# Patient Record
Sex: Female | Born: 1955 | Race: White | Hispanic: No | Smoking: Never smoker
Health system: Southern US, Community
[De-identification: ages and names within clinical notes are randomized; demographics above are authoritative.]

## PROBLEM LIST (undated history)

## (undated) DIAGNOSIS — C439 Malignant melanoma of skin, unspecified: Secondary | ICD-10-CM

## (undated) DIAGNOSIS — R2689 Other abnormalities of gait and mobility: Secondary | ICD-10-CM

## (undated) DIAGNOSIS — K219 Gastro-esophageal reflux disease without esophagitis: Secondary | ICD-10-CM

## (undated) DIAGNOSIS — F329 Major depressive disorder, single episode, unspecified: Secondary | ICD-10-CM

## (undated) DIAGNOSIS — R0609 Other forms of dyspnea: Secondary | ICD-10-CM

## (undated) DIAGNOSIS — D7589 Other specified diseases of blood and blood-forming organs: Secondary | ICD-10-CM

## (undated) DIAGNOSIS — R7301 Impaired fasting glucose: Secondary | ICD-10-CM

## (undated) DIAGNOSIS — E2839 Other primary ovarian failure: Secondary | ICD-10-CM

## (undated) DIAGNOSIS — K3184 Gastroparesis: Secondary | ICD-10-CM

## (undated) DIAGNOSIS — G510 Bell's palsy: Secondary | ICD-10-CM

## (undated) DIAGNOSIS — F419 Anxiety disorder, unspecified: Secondary | ICD-10-CM

## (undated) DIAGNOSIS — R5383 Other fatigue: Secondary | ICD-10-CM

## (undated) DIAGNOSIS — I10 Essential (primary) hypertension: Secondary | ICD-10-CM

## (undated) DIAGNOSIS — E7800 Pure hypercholesterolemia, unspecified: Secondary | ICD-10-CM

## (undated) DIAGNOSIS — F32A Depression, unspecified: Secondary | ICD-10-CM

## (undated) DIAGNOSIS — R300 Dysuria: Secondary | ICD-10-CM

## (undated) DIAGNOSIS — D219 Benign neoplasm of connective and other soft tissue, unspecified: Secondary | ICD-10-CM

## (undated) HISTORY — DX: Dysuria: R30.0

## (undated) HISTORY — DX: Malignant melanoma of skin, unspecified: C43.9

## (undated) HISTORY — PX: TONSILECTOMY, ADENOIDECTOMY, BILATERAL MYRINGOTOMY AND TUBES: SHX2538

## (undated) HISTORY — DX: Gastroparesis: K31.84

## (undated) HISTORY — DX: Impaired fasting glucose: R73.01

## (undated) HISTORY — DX: Depression, unspecified: F32.A

## (undated) HISTORY — DX: Major depressive disorder, single episode, unspecified: F32.9

## (undated) HISTORY — PX: FOOT SURGERY: SHX648

## (undated) HISTORY — DX: Other specified diseases of blood and blood-forming organs: D75.89

## (undated) HISTORY — DX: Anxiety disorder, unspecified: F41.9

## (undated) HISTORY — DX: Other abnormalities of gait and mobility: R26.89

## (undated) HISTORY — DX: Gastro-esophageal reflux disease without esophagitis: K21.9

## (undated) HISTORY — DX: Other forms of dyspnea: R06.09

## (undated) HISTORY — DX: Benign neoplasm of connective and other soft tissue, unspecified: D21.9

## (undated) HISTORY — DX: Other primary ovarian failure: E28.39

## (undated) HISTORY — DX: Other fatigue: R53.83

## (undated) HISTORY — DX: Pure hypercholesterolemia, unspecified: E78.00

## (undated) HISTORY — PX: ANKLE FRACTURE SURGERY: SHX122

## (undated) HISTORY — DX: Essential (primary) hypertension: I10

## (undated) HISTORY — DX: Bell's palsy: G51.0

## (undated) HISTORY — PX: TUBAL LIGATION: SHX77

---

## 1985-02-01 HISTORY — PX: TYMPANOSTOMY TUBE PLACEMENT: SHX32

## 1985-02-01 HISTORY — PX: NASAL SINUS SURGERY: SHX719

## 2000-01-04 ENCOUNTER — Encounter: Admission: RE | Admit: 2000-01-04 | Discharge: 2000-01-04 | Payer: Self-pay | Admitting: Sports Medicine

## 2000-03-01 ENCOUNTER — Other Ambulatory Visit: Admission: RE | Admit: 2000-03-01 | Discharge: 2000-03-01 | Payer: Self-pay | Admitting: Emergency Medicine

## 2000-11-22 ENCOUNTER — Encounter: Payer: Self-pay | Admitting: Family Medicine

## 2000-11-22 ENCOUNTER — Encounter: Admission: RE | Admit: 2000-11-22 | Discharge: 2000-11-22 | Payer: Self-pay | Admitting: Family Medicine

## 2001-01-13 ENCOUNTER — Encounter (INDEPENDENT_AMBULATORY_CARE_PROVIDER_SITE_OTHER): Payer: Self-pay | Admitting: *Deleted

## 2001-01-13 ENCOUNTER — Ambulatory Visit (HOSPITAL_COMMUNITY): Admission: RE | Admit: 2001-01-13 | Discharge: 2001-01-13 | Payer: Self-pay | Admitting: Gastroenterology

## 2002-03-23 ENCOUNTER — Encounter: Payer: Self-pay | Admitting: Family Medicine

## 2002-03-23 ENCOUNTER — Encounter: Admission: RE | Admit: 2002-03-23 | Discharge: 2002-03-23 | Payer: Self-pay | Admitting: Family Medicine

## 2003-07-19 ENCOUNTER — Encounter (INDEPENDENT_AMBULATORY_CARE_PROVIDER_SITE_OTHER): Payer: Self-pay | Admitting: Specialist

## 2003-07-19 ENCOUNTER — Ambulatory Visit (HOSPITAL_COMMUNITY): Admission: RE | Admit: 2003-07-19 | Discharge: 2003-07-19 | Payer: Self-pay | Admitting: Gastroenterology

## 2003-07-30 ENCOUNTER — Ambulatory Visit (HOSPITAL_COMMUNITY): Admission: RE | Admit: 2003-07-30 | Discharge: 2003-07-30 | Payer: Self-pay

## 2005-05-21 ENCOUNTER — Encounter: Admission: RE | Admit: 2005-05-21 | Discharge: 2005-05-21 | Payer: Self-pay | Admitting: Family Medicine

## 2005-06-08 ENCOUNTER — Encounter: Admission: RE | Admit: 2005-06-08 | Discharge: 2005-06-08 | Payer: Self-pay | Admitting: Family Medicine

## 2005-07-23 ENCOUNTER — Encounter: Admission: RE | Admit: 2005-07-23 | Discharge: 2005-07-23 | Payer: Self-pay | Admitting: *Deleted

## 2005-12-10 ENCOUNTER — Other Ambulatory Visit: Admission: RE | Admit: 2005-12-10 | Discharge: 2005-12-10 | Payer: Self-pay | Admitting: Family Medicine

## 2006-08-31 ENCOUNTER — Encounter: Admission: RE | Admit: 2006-08-31 | Discharge: 2006-08-31 | Payer: Self-pay | Admitting: Family Medicine

## 2007-06-14 ENCOUNTER — Other Ambulatory Visit: Admission: RE | Admit: 2007-06-14 | Discharge: 2007-06-14 | Payer: Self-pay | Admitting: Family Medicine

## 2007-10-18 ENCOUNTER — Encounter: Admission: RE | Admit: 2007-10-18 | Discharge: 2007-10-18 | Payer: Self-pay | Admitting: Family Medicine

## 2008-11-06 ENCOUNTER — Other Ambulatory Visit: Admission: RE | Admit: 2008-11-06 | Discharge: 2008-11-06 | Payer: Self-pay | Admitting: Family Medicine

## 2008-11-18 ENCOUNTER — Encounter: Admission: RE | Admit: 2008-11-18 | Discharge: 2008-11-18 | Payer: Self-pay | Admitting: Family Medicine

## 2009-11-28 ENCOUNTER — Encounter: Admission: RE | Admit: 2009-11-28 | Discharge: 2009-11-28 | Payer: Self-pay | Admitting: Family Medicine

## 2010-05-06 ENCOUNTER — Other Ambulatory Visit: Payer: Self-pay | Admitting: Family Medicine

## 2010-05-06 ENCOUNTER — Other Ambulatory Visit (HOSPITAL_COMMUNITY)
Admission: RE | Admit: 2010-05-06 | Discharge: 2010-05-06 | Disposition: A | Discharge status: Home / Self Care | Payer: BC Managed Care – PPO | Source: Ambulatory Visit | Attending: Family Medicine | Admitting: Family Medicine

## 2010-05-06 DIAGNOSIS — Z01419 Encounter for gynecological examination (general) (routine) without abnormal findings: Secondary | ICD-10-CM | POA: Insufficient documentation

## 2010-06-19 NOTE — Op Note (Signed)
NAME:  Carrie [name], Carrie [name]                         ACCOUNT NO.:  [id number]   MEDICAL RECORD NO.:  [id number]                   PATIENT TYPE:  AMB   LOCATION:  ENDO                                 FACILITY:  [facility]   PHYSICIAN:  John C. Madilyn Fireman, M.D.                 DATE OF BIRTH:  1955/10/07   DATE OF PROCEDURE:  07/19/2003  DATE OF DISCHARGE:                                 OPERATIVE REPORT   PROCEDURE:  Esophagogastroduodenoscopy.   INDICATION FOR PROCEDURE:  Chronic nausea and dyspepsia in [name] patient also  undergoing screening colonoscopy today.   PROCEDURE:  The patient was placed in the left lateral decubitus position  and placed on the pulse monitor with continuous low-flow oxygen delivered by  nasal cannula.  She was sedated with 70 mcg IV fentanyl and 7 mg IV Versed.  The Olympus video endoscope was advanced under direct vision into the  oropharynx and esophagus.  The esophagus was straight and of normal caliber  with the squamocolumnar line at 38 cm.  There was no visible hiatal hernia,  ring, stricture, or other abnormality of the GE junction.  The stomach was  entered and [name] small amount of liquid secretions was suctioned from the  fundus.  Retroflexed view of the cardia was unremarkable.  The fundus, body,  antrum, and pylorus all appeared normal.  The duodenum was entered and both  the bulb and second portion were well-inspected and appeared to be within  normal limits.  The scope was then withdrawn and the patient returned to the  recovery room in stable condition.  She tolerated the procedure well, and  there were no immediate complications.   IMPRESSION:  Normal endoscopy.   PLAN:  Will consider [name] nuclear medicine gastric emptying study based on the  nature of her symptoms, and will proceed to colonoscopy.                                               John C. Madilyn Fireman, M.D.    JCH/MEDQ  D:  07/19/2003  T:  07/20/2003  Job:  [id number]

## 2010-06-19 NOTE — Op Note (Signed)
NAME:  Carrie [name], Carrie [name]                         ACCOUNT NO.:  [id number]   MEDICAL RECORD NO.:  [id number]                   PATIENT TYPE:  AMB   LOCATION:  ENDO                                 FACILITY:  [facility]   PHYSICIAN:  John C. Madilyn Fireman, M.D.                 DATE OF BIRTH:  [birth year]   DATE OF PROCEDURE:  07/19/2003  DATE OF DISCHARGE:                                 OPERATIVE REPORT   PROCEDURE PERFORMED:  Colonoscopy.   ENDOSCOPIST:  Barrie Folk, M.D.   INDICATIONS FOR PROCEDURE:  Average risk colon cancer screening in [name] 55-year-  old patient with no prior screening.   DESCRIPTION OF PROCEDURE:  The patient was placed in the left lateral  decubitus position and placed on the pulse monitor with continuous low-flow  oxygen delivered by nasal cannula.  The patient was sedated with 30 mcg of  IV fentanyl and 3 mg of IV Versed in addition to the medicines given for the  previous esophagogastroduodenoscopy.  The Olympus video colonoscope was  inserted into the rectum and advanced to the cecum, confirmed by  transillumination of McBurney's point and visualization of the ileocecal  valve and appendiceal orifice.  The prep was excellent.  The cecum,  ascending, transverse, descending and sigmoid colon all appeared normal with  no masses, polyps, diverticula or other mucosal abnormalities.  In the  rectum there was [name] 6 mm polyp at 15 cm and this was removed by hot biopsy.  The remainder of the rectum appeared normal.  The scope was then withdrawn  and the patient returned to the recovery room in stable condition.  She  tolerated the procedure well.  There were no immediate complications.   IMPRESSION:  Small rectal polyp, otherwise normal study.   PLAN:  Await histology to determine method and interval for future colon  screening.                                               John C. Madilyn Fireman, M.D.    J[facility]/MEDQ  D:  07/19/2003  T:  07/20/2003  Job:  [id number]

## 2010-06-19 NOTE — Procedures (Signed)
[facility]  Patient:    Carrie [name], Carrie [name] Visit Number: [id number] MRN: [id number]          Service Type: END Location: ENDO Attending Physician:  Louie Bun Dictated by:   Everardo All Madilyn Fireman, M.D. Proc. Date: 01/13/01 Admit Date:  01/13/2001                             Procedure Report  PROCEDURE:  Esophagogastroduodenoscopy with biopsy.  INDICATION FOR PROCEDURE:  Multiple abdominal complaints with gaseousness, bloating and other vague dyspeptic symptoms quite bothersome to the patient with negative work-up to date and failure to respond to acid therapy, Beano, Lactaid, and Gas-X.  DESCRIPTION OF PROCEDURE:  The patient was placed in the left lateral decubitus position and placed on the pulse monitor with continuous low-flow oxygen delivered by nasal cannula.  She was sedated with 100 mg IV Demerol and ______ Olympus video endoscope was advanced under direct vision into the oropharynx and esophagus.  The esophagus was straight and of normal caliber with the squamocolumnar line at 38 cm.  There was no visible hiatal hernia, ring, stricture, or other abnormality of the GE junction.  The stomach was entered, and a small amount of liquid secretions were suctioned from the fundus.  Retroflexed view of the cardia was unremarkable.  The fundus, body, antrum, and pylorus all appeared normal.  The duodenum was entered, and both the bulb and second portion were well-inspected and appeared to be within normal limits.  Duodenal biopsies were taken to rule out celiac disease.  The scope was withdrawn back into the stomach and a CLOtest obtained.  The scope was then withdrawn, and the patient returned to the recovery room in stable condition.  She tolerated the procedure well, and there were no immediate complications.  IMPRESSION:  Essentially normal endoscopy.  PLAN:  Await small bowel biopsies and CLOtest.  If unrevealing, will try her on an empiric  course of proton pump inhibitor which she has not been on in the past.  Follow up in the office in a few weeks. Dictated by:   Everardo All Madilyn Fireman, M.D. Attending Physician:  Louie Bun DD:  01/13/01 TD:  01/13/01 Job: [id number]

## 2010-11-12 ENCOUNTER — Other Ambulatory Visit: Payer: Self-pay | Admitting: Family Medicine

## 2010-11-12 DIAGNOSIS — Z1231 Encounter for screening mammogram for malignant neoplasm of breast: Secondary | ICD-10-CM

## 2010-12-04 ENCOUNTER — Ambulatory Visit
Admission: RE | Admit: 2010-12-04 | Discharge: 2010-12-04 | Disposition: A | Discharge status: Home / Self Care | Payer: BC Managed Care – PPO | Source: Ambulatory Visit | Attending: Family Medicine | Admitting: Family Medicine

## 2010-12-04 DIAGNOSIS — Z1231 Encounter for screening mammogram for malignant neoplasm of breast: Secondary | ICD-10-CM

## 2011-11-15 ENCOUNTER — Other Ambulatory Visit: Payer: Self-pay | Admitting: Family Medicine

## 2011-11-15 DIAGNOSIS — Z1231 Encounter for screening mammogram for malignant neoplasm of breast: Secondary | ICD-10-CM

## 2011-12-13 ENCOUNTER — Ambulatory Visit (INDEPENDENT_AMBULATORY_CARE_PROVIDER_SITE_OTHER): Payer: BC Managed Care – PPO | Admitting: Family Medicine

## 2011-12-13 ENCOUNTER — Encounter: Payer: Self-pay | Admitting: Family Medicine

## 2011-12-13 VITALS — BP 127/70 | Ht 61.0 in | Wt 122.0 lb

## 2011-12-13 DIAGNOSIS — S76319A Strain of muscle, fascia and tendon of the posterior muscle group at thigh level, unspecified thigh, initial encounter: Secondary | ICD-10-CM

## 2011-12-13 DIAGNOSIS — S76312A Strain of muscle, fascia and tendon of the posterior muscle group at thigh level, left thigh, initial encounter: Secondary | ICD-10-CM

## 2011-12-13 DIAGNOSIS — IMO0002 Reserved for concepts with insufficient information to code with codable children: Secondary | ICD-10-CM

## 2011-12-13 MED ORDER — NITROGLYCERIN 0.2 MG/HR TD PT24: MEDICATED_PATCH | TRANSDERMAL | Status: DC

## 2011-12-13 MED ORDER — PREDNISONE (PAK) 10 MG PO TABS: ORAL_TABLET | ORAL | Status: DC

## 2011-12-13 NOTE — Progress Notes (Signed)
  Subjective:    Patient ID: Carrie [name], female    DOB: 1956-01-20, 56 y.o.   MRN: [id number]  PCP: Dr. Gerri Spore  HPI 56 yo F here for left leg injury.  Patient reports about 2 months ago started to develop proximal left leg/hamstring pain without injury. Would be worse with walking/running, better with rest but not completely so. 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 No swelling, bruising. No numbness/tingling. No radiation into rest of leg. No bowel/bladder dysfunction. No prior left hamstring/buttock injuries. No right sidep ain. No back pain. Tried advil, ice, rest, passive stretching (this brings on the pain).  History reviewed. No pertinent past medical history.  Current Outpatient Prescriptions on File Prior to Visit  Medication Sig Dispense Refill  . CYMBALTA 30 MG capsule       . nitroGLYCERIN (NITRODUR - DOSED IN MG/24 HR) 0.2 mg/hr Apply 1/4th patch to affected hamstring and change daily  30 patch  1    History reviewed. No pertinent past surgical history.  Allergies  Allergen Reactions  . Sulfa Antibiotics     History   Social History  . Marital Status: Divorced    Spouse Name: N/A    Number of Children: N/A  . Years of Education: N/A   Occupational History  . Not on file.   Social History Main Topics  . Smoking status: Never Smoker   . Smokeless tobacco: Not on file  . Alcohol Use: Not on file  . Drug Use: Not on file  . Sexually Active: Not on file   Other Topics Concern  . Not on file   Social History Narrative  . No narrative on file    Family History  Problem Relation Age of Onset  . Hypertension Father   . Diabetes Neg Hx   . Heart attack Neg Hx   . Hyperlipidemia Neg Hx   . Sudden death Neg Hx     BP 127/70  Ht 5\' 1"  (1.549 m)  Wt 122 lb (55.339 kg)  BMI 23.05 kg/m2  Review of Systems See HPI  above.    Objective:   Physical Exam Gen: NAD  Back/L leg: No gross deformity, scoliosis. TTP medial hamstring proximally and at insertion.  Less TTP around piriformis.  No back, other left leg TTP. FROM without pain of back, left hip. Strength 4/5 with knee flexion at 30 degrees, 5-/5 at 90 degrees - both reproduce her pain.  5/5 strength with all other motions including abduction.  Some pain with ext rotation.   2+ MSRs in patellar and achilles tendons, equal bilaterally. Negative SLRs. Sensation intact to light touch bilaterally. Negative logroll bilateral hips Negative fabers and piriformis stretches.    Assessment & Plan:  1. Left hamstring strain - History and exam consistent with left proximal hamstring strain.  Ultrasound identified edema overlying insertion, some neovascularity as well that's not present right hamstring insertion.  Shown home rehabilitation exercises, discussed slowly advancing activities.  Start nitro patches as well.  Compression shorts.  She would like to try prednisone dose pack.  See instructions for further.  F/u in 6 weeks.

## 2011-12-13 NOTE — Assessment & Plan Note (Signed)
History and exam consistent with left proximal hamstring strain.  Ultrasound identified edema overlying insertion, some neovascularity as well that's not present right hamstring insertion.  Shown home rehabilitation exercises, discussed slowly advancing activities.  Start nitro patches as well.  Compression shorts.  She would like to try prednisone dose pack.  See instructions for further.  F/u in 6 weeks.

## 2011-12-13 NOTE — Patient Instructions (Addendum)
You have strained your proximal hamstring. Hamstring curls, swings, running lunges 3 sets of 10 once a day. Standing hip rotations 3 sets of 10 once a day also If these become too easy and pain is a 1-2/10 or less, add ankle weight. When you have 1/10 pain or no pain with the entire program, move to phase 2 (will likely be about 3 weeks). Phase 2 - add more weight (5 pounds) to curls and swings, jumping exercises I showed you (jumping lunge, bounding). Use compression shorts with exercise at least next 6 weeks Avoid speed work, hills as much as possible. Stretching should be done after exercise or after a 5-10 minute warmup - for flexibility hold stretches for 20-30 seconds, repeat 3 times. Heat before, ice after. Nitroglycerin 1/4th of a patch over affected hamstring - change every day into a different spot. Prednisone dose pack as directed x 6 days - do not take ibuprofen, advil, aleve with this. Tylenol 2 tabs four times a day as needed for pain. Use pain as your guide - when not limping and pain is 1-2/10 with brisk walk or jogging, ok to start walk:jog program. 1:1 walk:jog for total 10 minutes - increase total exercise time by 5 minutes every other day and jog time by 1-2 minutes (so next run would be total 15 minutes of 1:2 walk: jog) Follow up with me in 6 weeks.

## 2011-12-17 ENCOUNTER — Ambulatory Visit
Admission: RE | Admit: 2011-12-17 | Discharge: 2011-12-17 | Disposition: A | Discharge status: Home / Self Care | Payer: BC Managed Care – PPO | Source: Ambulatory Visit | Attending: Family Medicine | Admitting: Family Medicine

## 2011-12-17 DIAGNOSIS — Z1231 Encounter for screening mammogram for malignant neoplasm of breast: Secondary | ICD-10-CM

## 2012-04-05 ENCOUNTER — Other Ambulatory Visit: Payer: Self-pay | Admitting: Dermatology

## 2012-11-22 ENCOUNTER — Other Ambulatory Visit: Payer: Self-pay

## 2012-11-22 DIAGNOSIS — Z1231 Encounter for screening mammogram for malignant neoplasm of breast: Secondary | ICD-10-CM

## 2012-12-29 ENCOUNTER — Ambulatory Visit
Admission: RE | Admit: 2012-12-29 | Discharge: 2012-12-29 | Disposition: A | Discharge status: Home / Self Care | Payer: BC Managed Care – PPO | Source: Ambulatory Visit

## 2012-12-29 DIAGNOSIS — Z1231 Encounter for screening mammogram for malignant neoplasm of breast: Secondary | ICD-10-CM

## 2013-02-27 ENCOUNTER — Ambulatory Visit: Payer: BC Managed Care – PPO | Admitting: Family Medicine

## 2013-04-03 ENCOUNTER — Other Ambulatory Visit: Payer: Self-pay | Admitting: Dermatology

## 2013-07-05 ENCOUNTER — Other Ambulatory Visit: Payer: Self-pay | Admitting: Family Medicine

## 2013-07-05 ENCOUNTER — Other Ambulatory Visit (HOSPITAL_COMMUNITY)
Admission: RE | Admit: 2013-07-05 | Discharge: 2013-07-05 | Disposition: A | Discharge status: Home / Self Care | Payer: BC Managed Care – PPO | Source: Ambulatory Visit | Attending: Family Medicine | Admitting: Family Medicine

## 2013-07-05 DIAGNOSIS — Z124 Encounter for screening for malignant neoplasm of cervix: Secondary | ICD-10-CM | POA: Insufficient documentation

## 2013-07-05 DIAGNOSIS — Z1151 Encounter for screening for human papillomavirus (HPV): Secondary | ICD-10-CM | POA: Insufficient documentation

## 2013-07-09 LAB — CYTOLOGY - PAP

## 2014-01-10 ENCOUNTER — Other Ambulatory Visit: Payer: Self-pay

## 2014-01-10 DIAGNOSIS — Z1231 Encounter for screening mammogram for malignant neoplasm of breast: Secondary | ICD-10-CM

## 2014-01-11 ENCOUNTER — Other Ambulatory Visit: Payer: Self-pay

## 2014-01-31 ENCOUNTER — Ambulatory Visit
Admission: RE | Admit: 2014-01-31 | Discharge: 2014-01-31 | Disposition: A | Discharge status: Home / Self Care | Payer: BC Managed Care – PPO | Source: Ambulatory Visit

## 2014-01-31 DIAGNOSIS — Z1231 Encounter for screening mammogram for malignant neoplasm of breast: Secondary | ICD-10-CM

## 2014-05-09 ENCOUNTER — Other Ambulatory Visit: Payer: Self-pay | Admitting: Gastroenterology

## 2014-11-18 ENCOUNTER — Telehealth: Payer: Self-pay | Admitting: Neurology

## 2014-11-18 NOTE — Telephone Encounter (Signed)
Terrence Dupont,  Dr. Ernesto Rutherford would like me to see this patient. Last appointment of the morning or last appt in the evening would be best so I have extra time thank you !!!

## 2014-11-18 NOTE — Telephone Encounter (Signed)
Dr. Ernesto Rutherford [phone] would like for Dr. Jaynee Eagles to call him back regarding patient (not a current [facility] patient).

## 2014-11-19 NOTE — Telephone Encounter (Signed)
LVM for pt to call back and schedule NP appointment with Dr. Jaynee Eagles. Please schedule last of the morning and end of the day so Dr. Jaynee Eagles has enough time. Gave [facility] phone number and office hours.

## 2014-11-25 NOTE — Telephone Encounter (Signed)
LVM for pt to call and schedule NP appt with Dr. Arrie Senate per Dr. Ernesto Rutherford request. Please schedule last of morning/end of day so Dr. Jaynee Eagles has enough time.

## 2014-11-26 NOTE — Telephone Encounter (Signed)
LVM for pt to call and reschedule NP appt made. Was supposed to be scheduled last of the morning or end of the day. Apologized to the pt. Gave [facility] phone number.

## 2014-11-27 NOTE — Telephone Encounter (Signed)
Pt scheduled on 12/04/14 at 2pm. Dr Jaynee Eagles okay with this.

## 2014-12-03 ENCOUNTER — Ambulatory Visit: Payer: 59 | Admitting: Neurology

## 2014-12-04 ENCOUNTER — Ambulatory Visit (INDEPENDENT_AMBULATORY_CARE_PROVIDER_SITE_OTHER): Payer: 59 | Admitting: Neurology

## 2014-12-04 ENCOUNTER — Encounter: Payer: Self-pay | Admitting: Neurology

## 2014-12-04 VITALS — BP 128/84 | HR 55 | Ht 61.0 in | Wt 125.6 lb

## 2014-12-04 DIAGNOSIS — H8110 Benign paroxysmal vertigo, unspecified ear: Secondary | ICD-10-CM | POA: Diagnosis not present

## 2014-12-04 MED ORDER — SCOPOLAMINE 1 MG/3DAYS TD PT72: 1.0000 | MEDICATED_PATCH | TRANSDERMAL | Status: DC

## 2014-12-04 NOTE — Patient Instructions (Addendum)
Remember to drink plenty of fluid, eat healthy meals and do not skip any meals. Try to eat protein with a every meal and eat a healthy snack such as fruit or nuts in between meals. Try to keep a regular sleep-wake schedule and try to exercise daily, particularly in the form of walking, 20-30 minutes a day, if you can.   As far as your medications are concerned, I would like to suggest: Scopolamine patch every 3 days  I would like to see you back as needed, sooner if we need to. Please call us with any interim questions, concerns, problems, updates or refill requests.   Our phone number is [phone]. We also have an after hours call service for urgent matters and there is a physician on-call for urgent questions. For any emergencies you know to call 911 or go to the nearest emergency room  VESTIBULAR REHABILITATION Potential benefits - Studies in humans and animal models have shown that clinical recovery after peripheral vestibular injury occurs in advance of improved peripheral vestibular function, suggesting that most of the early recovery and a substantial portion of the total recovery derives from central nervous system compensation [5-8]. This central compensation appears to be multisensory in its scope and is the primary target of vestibular rehabilitation [9]. There is some evidence that early rehabilitation is more effective than late intervention [10,11].  Vestibular rehabilitation (physical therapy) promotes recovery. It is not known whether vestibular rehabilitation is useful for central vestibular disorders, although preliminary evidence suggests that it might have benefit [13,14]. Referral can be arranged through most physiotherapy departments or dizzy clinics. Most patients with vertigo prefer to lie with their head still. Vestibular rehabilitation forces them to perform challenging balance exercises with several potential benefits:  ?Activity promotes adaptation - The brain can  readjust or adapt its responses to take into account reduced vestibular input, particularly if one side is still normal. This is optimally accomplished when the brain has experience with vision during head motion, to determine how much error the lesion has introduced [15].  ?Activity facilitates strategic substitution - There are other means of reducing spatial uncertainty, even if the vestibular system cannot recover. The cervico-ocular reflex can increase its input, and other eye movements can help stabilize gaze [16]. Alternative spatial cues from vision and proprioception can improve balance and walking.  ?Inactivity has secondary negative effects - Patients may become physically deconditioned, which exacerbates the inadequacy of their postural reflexes. They may also become psychologically deconditioned, sometimes to the point where a "persistent postural-perceptual dizziness" (also called phobic postural vertigo, chronic subjective dizziness) becomes the greatest obstacle to their recovery [17-20]. Fear of falling is particularly problematic in the elderly after a vestibular event, and it can limit mobility indefinitely without a rehabilitation program [21].  Cause-specific efficacy - Vestibular exercises have been used for 60 years [22,23]. However, they have only more recently been studied in randomized, controlled trials. Limitations of these studies include that they are unblinded with short-term follow-up. The evidence of their benefit is most robust for unilateral peripheral vestibular disorders [24]. There is not sufficient evidence to determine whether one form of rehabilitation is more effective than another.  ?Acute peripheral vertigo - The exercises commonly provided for persons with acute vestibular events include focusing on an object with a blank background and moving the head slowly to the right and left. The exercise is done in the pitch (up/down) and yaw (left/right) planes. Shortly  after a vestibular injury the patient is encouraged to  move his or her head slowly to avoid severe nausea. Then the speed is increased as the patient tolerates. Most patients are asked to perform these exercises two to three times each day for several minutes.  Two trials have shown that vestibular exercises begun shortly after the onset of acute unilateral vestibular hypofunction due to vestibular neuritis or vestibular surgery significantly improve balance at one month [25,26]. It is not known whether the improvements persist beyond one month.  ?Chronic peripheral vertigo - The exercises recommended for persons with chronic peripheral vertigo are generally much more aggressive than those for persons with acute disorders. Patients are asked to perform eye and head movements while standing, walking forward, walking backward, and while standing or walking on compliant or uneven surfaces. The focus of the program is to perform increasingly more difficult postural tasks with increasingly more eye and head movements while keeping symptoms manageable.  A study of patients with chronic dizziness for at least six months duration due to unilateral peripheral vestibular dysfunction found that dizziness improved with either vestibular therapy or drugs, but improved balance occurred only in the group receiving physical therapy [27].  Treatment with home exercises may be as effective as supervised outpatient therapy [28-30]. Two randomized controlled trials of patients with chronic peripheral vestibular disorders in general practice evaluated a standard home vestibular program [28,29]. Treated patients had significant improvement on all primary outcome measures (dizziness, dizziness-related quality of life, and postural stability) compared with the control groups at three and six months of follow-up [10]. A limitation of these studies is that participants could not be blinded to treatment status.
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
## 2014-12-05 ENCOUNTER — Telehealth: Payer: Self-pay | Admitting: *Deleted

## 2014-12-05 NOTE — Telephone Encounter (Signed)
Release faxed on 12/05/14 requesting imaging.

## 2014-12-09 ENCOUNTER — Telehealth: Payer: Self-pay | Admitting: Neurology

## 2014-12-09 NOTE — Telephone Encounter (Signed)
We requested records, but we have not received them yet. Carrie [name] can you verify we got the medical release form and her ophthalmologist's name ot get there ecords? thanks

## 2014-12-09 NOTE — Telephone Encounter (Signed)
Called pt back. Advised I spoke with medical records and we have not received requested records yet. Carrie [name] is MR is going to call first thing in the morning to check on status. Pt wanted to have any surgery if Dr Jaynee Eagles wants her to have one before the first of the year because she is going to lose insurance. She is back to work and walking and not running. I told her I will call her back to update her once I know more. She verbalized understanding.

## 2014-12-09 NOTE — Telephone Encounter (Signed)
Thanks, not sure I can give her advice on whether to have surgery for cular spasms on not but I can read the notes and tell her what ophthalmologist says. She should really follow back up with him but I will call her about this. thanks

## 2014-12-09 NOTE — Telephone Encounter (Signed)
Patient called to inquire if Dr. Jaynee Eagles contacted Dr. Rosezetta Schlatter Specialist?

## 2014-12-10 NOTE — Telephone Encounter (Signed)
Notes on [employer].

## 2014-12-10 NOTE — Telephone Encounter (Signed)
Where are the records?

## 2014-12-10 NOTE — Telephone Encounter (Signed)
Received records from [facility]. Will give to Dr Jaynee Eagles.

## 2014-12-11 NOTE — Telephone Encounter (Signed)
Dr Jaynee Eagles-- I put them with your office notes.

## 2014-12-12 NOTE — Telephone Encounter (Signed)
Left a message for patient. 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 I di dnot notice that on exam. She would like me to explain the opthalmologist's notes and findings. i left a message for him and have not heard back. At this point I feel it is beter for her to go see him in the office and ask him to explain his findings and what he wants her to do. She mentioned a surgical procedure but I didn't see that in there. She really needs to follow up with him directly. thanks

## 2015-03-11 ENCOUNTER — Other Ambulatory Visit: Payer: Self-pay

## 2015-03-11 DIAGNOSIS — Z1231 Encounter for screening mammogram for malignant neoplasm of breast: Secondary | ICD-10-CM

## 2015-04-04 ENCOUNTER — Ambulatory Visit
Admission: RE | Admit: 2015-04-04 | Discharge: 2015-04-04 | Disposition: A | Discharge status: Home / Self Care | Payer: BLUE CROSS/BLUE SHIELD | Source: Ambulatory Visit

## 2015-04-04 DIAGNOSIS — Z1231 Encounter for screening mammogram for malignant neoplasm of breast: Secondary | ICD-10-CM

## 2015-09-12 DIAGNOSIS — F411 Generalized anxiety disorder: Secondary | ICD-10-CM | POA: Diagnosis not present

## 2015-09-12 DIAGNOSIS — F3342 Major depressive disorder, recurrent, in full remission: Secondary | ICD-10-CM | POA: Diagnosis not present

## 2015-10-01 ENCOUNTER — Ambulatory Visit (INDEPENDENT_AMBULATORY_CARE_PROVIDER_SITE_OTHER): Payer: BLUE CROSS/BLUE SHIELD | Admitting: Sports Medicine

## 2015-10-01 ENCOUNTER — Encounter: Payer: Self-pay | Admitting: Sports Medicine

## 2015-10-01 DIAGNOSIS — S76312A Strain of muscle, fascia and tendon of the posterior muscle group at thigh level, left thigh, initial encounter: Secondary | ICD-10-CM | POA: Diagnosis not present

## 2015-10-01 MED ORDER — NITROGLYCERIN 0.2 MG/HR TD PT24: MEDICATED_PATCH | TRANSDERMAL | 1 refills | Status: DC

## 2015-10-01 NOTE — Progress Notes (Signed)
Carrie [name] - 60 y.o. female MRN [id number]  Date of birth: [birth year]  SUBJECTIVE:  Including CC & ROS.  Chief Complaint  Patient presents with  . Hip Pain     [name] is a 60 year old female that is presenting with left hamstring pain. This is been ongoing for about a year. This pain is acute on chronic in nature. She remembers running about a year ago and had a significant fall forward. Since that time she has had a deal with this issue. The pain is located at the left upper hamstring were he goes into her buttock. She has tried mobic, Advil, prednisone. She is also tried rest and no rest with no improvement. She has not tried any formal physical therapy. She has pain with sitting. She has tried dry and healing with no improvement. She denies any prior injury to that area. She has had to change the way that she is running. 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 She is having to run less and her walking seems to be affected as well. The pain is localized with no radiation. She denies any radiculopathy.  ROS: No unexpected weight loss, fever, chills, swelling, instability, numbness/tingling, redness, otherwise see HPI    HISTORY: Past Medical, Surgical, Social, and Family History Reviewed & Updated per EMR.   Pertinent Historical Findings include: PMSHx -  HTN, BPPV  PSHx -  No tobacco. Occasional alcohol use, works as a Physiological scientist  FHx -  HTN Medications - cymbalta,   DATA REVIEWED: None to review   PHYSICAL EXAM:  VS: BP:(!) 140/100  HR:(!) 55bpm  TEMP: ( )  RESP:   HT:5\' 2"  (157.5 cm)   WT:125 lb (56.7 kg)  BMI:22.9 PHYSICAL EXAM: Gen: NAD, alert, cooperative with exam, well-appearing HEENT: clear conjunctiva, EOMI CV:  no edema, capillary refill brisk,  Resp: non-labored, normal speech Skin: no rashes, normal turgor  Neuro: no gross deficits.  Psych:  alert and oriented HIP:  No overlying ecchymosis or obvious defect No  pain to palpation of the greater trochanter bilaterally. Normal internal and external rotation of the hip bilaterally. No pain to palpation of the piriformis bilaterally. No pain to palpation over the SI joints bilaterally. No pain to palpation over the cervical spine. Some pain to palpation over the initial tuberosity of the left side. No pain with knee extension. Some pain reproduced with resistance to need flexion. Negative Fabere and Feder bilaterally Negative straight leg raise bilaterally. +2 deep tendon reflexes the patella and Achilles bilaterally. Neurovascularly intact distally. Gait She is taking short strides and having an antalgic gait. Looks to be in pain while running.  Limited ultrasound: Left hamstring: Initial tuberosity was viewed in plane and out of plane. Origin of the biceps femoris had hypoechoic changes as well as thickening suggestive of tendinopathy. There was a small calcification within the tendon at the ischial tuberosity which suggests an old avulsion. There is no partial tears within the biceps femoris, semitendinosus and semimembranosus. There was a hypoechoic change tracking along the semimembranosus which would suggest fluid.  ASSESSMENT & PLAN:   Left hamstring muscle strain Upon chart reviewed appears that she has that with this issue before. 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 - Encouraged to obtain a body helix of the hamstring. - Initiated nitroglycerin protocol. - Advised to limit the amount lunging that she does. - Provided hamstring exercises. - Discouraged her from running at  this time as well. - She'll follow-up in 6 weeks to monitor improvement.

## 2015-10-01 NOTE — Patient Instructions (Signed)

## 2015-10-02 NOTE — Assessment & Plan Note (Signed)
Upon chart reviewed appears that she has that with this issue before. 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 - Encouraged to obtain a body helix of the hamstring. - Initiated nitroglycerin protocol. - Advised to limit the amount lunging that she does. - Provided hamstring exercises. - Discouraged her from running at this time as well. - She'll follow-up in 6 weeks to monitor improvement.

## 2015-10-16 ENCOUNTER — Encounter: Payer: Self-pay | Admitting: Sports Medicine

## 2015-10-16 ENCOUNTER — Ambulatory Visit (INDEPENDENT_AMBULATORY_CARE_PROVIDER_SITE_OTHER): Payer: BLUE CROSS/BLUE SHIELD | Admitting: Sports Medicine

## 2015-10-16 VITALS — BP 144/98 | HR 61 | Ht 62.0 in | Wt 127.0 lb

## 2015-10-16 DIAGNOSIS — S76312D Strain of muscle, fascia and tendon of the posterior muscle group at thigh level, left thigh, subsequent encounter: Secondary | ICD-10-CM | POA: Diagnosis not present

## 2015-10-16 MED ORDER — METHYLPREDNISOLONE ACETATE 40 MG/ML IJ SUSP
40.0000 mg | Freq: Once | INTRAMUSCULAR | Status: AC
  Administered 2015-10-16: 40 mg via INTRAMUSCULAR

## 2015-10-16 NOTE — Assessment & Plan Note (Signed)
Changes of calcific tendinopathy at the insertion on ultrasound  Corticosteroid injection completed today  Continue nitroglycerin protocol  Take 1 week of rest and then resume hamstring exercises Gradually resume some of her activities and personal training Recheck in one month

## 2015-10-16 NOTE — Progress Notes (Signed)
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 hamstring injury at the ischial tuberosity on the left We started  exercises Nitroglycerin protocol She has tried to continue her personal fitness training for that involves walking one hour straight for a few times during the week feels that her pain is worse The still is directly over the ischial tuberosity Also hurts with sitting  ROS No sciatica No numbness in lower leg  PE Pleasant F/ anxious but in NAD BP (!) 144/98   Pulse 61   Ht 5\' 2"  (1.575 m)   Wt 127 lb (57.6 kg)   BMI 23.23 kg/m   Full ROM of hip No Pain on palpation of hip muscles GT non TTP Norm pelvic motion TTP directly over ischial tuberosity  [place] of Left Ischial tuberosity Calcification at insertion of BT Tendon is 0.70 thich Appears hypoechoic No separate bursal swelling  Procedure:  Injection of left Ischial tuberosity Consent obtained and verified. Time-out conducted. Noted no overlying erythema, induration, or other signs of local infection. Skin prepped in a sterile fashion. Topical analgesic spray: Ethyl chloride. Completed without difficulty. I used [place] to be sure we introduced medication into tendon insertion.  Clear steroid flush noted post injection. Meds: 40 mg solumedrol and 4 cc lidocaine 1% Pain immediately improved suggesting accurate placement of the medication. Advised to call if fevers/chills, erythema, induration, drainage, or persistent bleeding.

## 2015-11-01 DIAGNOSIS — Z23 Encounter for immunization: Secondary | ICD-10-CM | POA: Diagnosis not present

## 2015-11-04 ENCOUNTER — Ambulatory Visit: Payer: BLUE CROSS/BLUE SHIELD | Admitting: Sports Medicine

## 2015-11-11 ENCOUNTER — Ambulatory Visit: Payer: BLUE CROSS/BLUE SHIELD | Admitting: Sports Medicine

## 2015-11-13 ENCOUNTER — Ambulatory Visit: Payer: BLUE CROSS/BLUE SHIELD | Admitting: Sports Medicine

## 2015-11-18 ENCOUNTER — Encounter: Payer: Self-pay | Admitting: Sports Medicine

## 2015-11-18 ENCOUNTER — Ambulatory Visit (INDEPENDENT_AMBULATORY_CARE_PROVIDER_SITE_OTHER): Payer: BLUE CROSS/BLUE SHIELD | Admitting: Sports Medicine

## 2015-11-18 DIAGNOSIS — S76312D Strain of muscle, fascia and tendon of the posterior muscle group at thigh level, left thigh, subsequent encounter: Secondary | ICD-10-CM | POA: Diagnosis not present

## 2015-11-18 MED ORDER — CYCLOBENZAPRINE HCL 10 MG PO TABS: 10.0000 mg | ORAL_TABLET | Freq: Every day | ORAL | 1 refills | Status: DC

## 2015-11-18 NOTE — Progress Notes (Signed)
   Subjective:  Patient ID: Carrie [name], female    DOB: [birth year]  Age: 60 y.o. MRN: [id number]  CC: Left Hamstring Follow-Up  HPI TAKISHA KUETHER is a 60 year old female who presents today for left hamstring follow-up. Patient injured hamstring about a year ago and did not come in to the practice to get it evaluated until about a 6 wks ago. At that time started HEP and NTG protocol. 4 wks ago she received a glucocorticoid injection and received exercises to strengthen her hamstring. Patient reports that the cortisone shot she received last time lasted about 2 weeks before her pain came back. Patient reports that pain is in her left ischial tuberosity. 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 Patient reports that the pain is aggravated especially with walking upstairs. Patient reports she has been compliant with compression sleeve use and hamstring strengthening exercises.   Patient reports she has not had much benefit nitroglycerin patches and she has stopped using them especially since they keep falling off when utilizing the compression sleeve. Patient reports that she has also been sitting on a heating pack to alleviate the pain.  Patient really wants another glucocorticoid injection.   ROS Review of Systems  Musculoskeletal:       Left Hamstring Pain  Refer to HPI for pertinent negatives and positives No Sciatica NO LBP Objective:  BP (!) 143/97   Physical Exam  Constitutional: She appears well-developed and well-nourished.  Musculoskeletal:  Left Hip: No lesions, deformities, or swelling. Point tenderness at the ischial tuberosity. Pain with 5 degree knee flexion with extension of hip and 90 degree knee flexion with extension of the hip, but otherwise normal ROM. Normal strength. Neurovascularly intact.  Right Hip: No lesions, deformities, or swelling. No tenderness. Normal ROM. Normal strength. Neurovascularly intact.    HS  strength tested in 3 positions and was good Able to do walking lunge Able to do 1 foot hop x 5 Dynamic exercises cause 5/10 pain Assessment & Plan:   1. Left Hamstring Pain Secondary to Partial Hamstring Tear Patient's hamstring strength has markedly increased, but patient still feels pain. Since patient feels NG patches are not working, we have d/c NG patches. Recommended patient continued extender and diver exercises and continued to use compression sleeve. Added backward walking, lunge walking,  heel raise taps, and hamstring curls & swings to exercises patient should do to continue strengthening hamstring. Patient agreed. Will reevaluate patient in 1 month with U/S and see if patient needs glucocorticoid injection at that time.   Follow-up: 1 month   Salvadore Dom, Medical Student  I observed and examined the patient with the student and agree with assessment and plan.  Note reviewed and modified by me.  Ila Mcgill, MD

## 2015-11-18 NOTE — Assessment & Plan Note (Signed)
While patient still has pain up to 5/10 Walking is much improved Able to hop Strength is  Much improved and pain is only moderate on testing  I encouraged patience and would not inject CSI again unless really not responding  Reck 1 mo.

## 2015-11-18 NOTE — Patient Instructions (Signed)
1. Add baby lunges - 15 baby lunges, rest -> try to build up to 2-3 sets. Keep up to 3 pain level 2. Backward walking - 15 steps, 3 sets 3. Heel raises taps - build in to hops gradually. 15, 3 sets 4. Keep up extender and diver exercises 5. Continue to use compression sleeve 6. Stop nitroglycerin 7. Buy 2 lb ankle weight and start doing hamstring curl and swings. 3 sets of 15.  8. Keep pain level at 3 or below with exercises

## 2015-11-25 ENCOUNTER — Telehealth: Payer: Self-pay | Admitting: *Deleted

## 2015-11-25 MED ORDER — METHOCARBAMOL 500 MG PO TABS: 500.0000 mg | ORAL_TABLET | Freq: Four times a day (QID) | ORAL | 0 refills | Status: DC

## 2015-11-25 NOTE — Telephone Encounter (Signed)
Per Dr Oneida Alar, we will d/c Flexeril and Start Robaxin

## 2015-12-09 ENCOUNTER — Ambulatory Visit (INDEPENDENT_AMBULATORY_CARE_PROVIDER_SITE_OTHER): Payer: BLUE CROSS/BLUE SHIELD | Admitting: Sports Medicine

## 2015-12-09 ENCOUNTER — Encounter: Payer: Self-pay | Admitting: Sports Medicine

## 2015-12-09 ENCOUNTER — Ambulatory Visit: Payer: Self-pay

## 2015-12-09 VITALS — BP 127/89 | Ht 62.0 in | Wt 127.0 lb

## 2015-12-09 DIAGNOSIS — S76312D Strain of muscle, fascia and tendon of the posterior muscle group at thigh level, left thigh, subsequent encounter: Secondary | ICD-10-CM | POA: Diagnosis not present

## 2015-12-09 MED ORDER — KETOROLAC TROMETHAMINE 60 MG/2ML IM SOLN
60.0000 mg | Freq: Once | INTRAMUSCULAR | Status: AC
  Administered 2015-12-09: 60 mg via INTRAMUSCULAR

## 2015-12-09 NOTE — Patient Instructions (Signed)
You have made progress by ultrasound and exam Less calcification  Today we injected Toradol This is to speed pain relief and inflammation  Rare side-effects -GI bleeding or irritation  Exercises You have gained strength but don't overdo Don;'t increase the stretch on Hamstring as this is at its attachment to bone  Make lunges easier but keep them up Walking is good but control stride Extender on back - stop hip flexion at 90 deg Keep up diver Do some easy hamstring curls either leg weight or light ankle weight  Return in 1 month

## 2015-12-09 NOTE — Assessment & Plan Note (Signed)
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

## 2015-12-09 NOTE — Progress Notes (Signed)
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 She did improve with steroid injection but that effect is wearing off She continues her home exercises Pain level is 5/10 The pain worsens if she walks uphill Worsens with too much stretching  Past history Anxiety issues for which she has used Xanax Mood issues for which she's used Wellbutrin and Cymbalta  Social history Nonsmoker  Review of systems No sciatica Strength seems to be improving Feels that anxiety sometimes worsens her symptoms No pain at nighttime  Physical examination Muscular older female in no acute distress BP 127/89   Ht 5\' 2"  (1.575 m)   Wt 127 lb (57.6 kg)   BMI 23.23 kg/m   Pain at the insertion of the left hamstring to the initial tuberosity This is felt on deep palpation Muscle belly is intact and is nonpainful Strength testing in 3 foot positions with eccentric contraction is good Strength testing with 90 of knee flexion is good H test is Tighter on the left but gets to 90 She is able to demonstrate all home exercises  Ultrasound of left hamstring There is hypoechoic change noted at the insertion of the hamstring tendons to the initial tuberosity There continues to be some calcifications noted in this area Does appear to be less than noted before There is less hypoechoic change extending along the tendon sheath Muscle fibers are intact  Ultrasound guidance for injection on short axis allowed visualization of needle placement at the it she'll tuberosity in the area of hypoechoic change  Ultrasound and interpretation by Stefanie Libel M.D.  Procedure:  Injection of left ischial tuberosity Consent obtained and verified. Time-out conducted. Noted no overlying erythema, induration, or other signs of local infection. Skin prepped in a sterile fashion. Topical  analgesic spray: Ethyl chloride. Completed without difficulty. I used ultrasound guidance to visualize injection to the area of hypoechoic change. Meds: 2 mL of Toradol and 2 mL of 1% lidocaine Pain immediately improved suggesting accurate placement of the medication. Advised to call if fevers/chills, erythema, induration, drainage, or persistent bleeding.

## 2015-12-18 ENCOUNTER — Ambulatory Visit: Payer: BLUE CROSS/BLUE SHIELD | Admitting: Sports Medicine

## 2015-12-31 DIAGNOSIS — H539 Unspecified visual disturbance: Secondary | ICD-10-CM | POA: Diagnosis not present

## 2015-12-31 DIAGNOSIS — H538 Other visual disturbances: Secondary | ICD-10-CM | POA: Diagnosis not present

## 2015-12-31 DIAGNOSIS — H2511 Age-related nuclear cataract, right eye: Secondary | ICD-10-CM | POA: Diagnosis not present

## 2016-01-08 ENCOUNTER — Encounter: Payer: Self-pay | Admitting: Sports Medicine

## 2016-01-08 ENCOUNTER — Ambulatory Visit (INDEPENDENT_AMBULATORY_CARE_PROVIDER_SITE_OTHER): Payer: BLUE CROSS/BLUE SHIELD | Admitting: Sports Medicine

## 2016-01-08 DIAGNOSIS — S76312D Strain of muscle, fascia and tendon of the posterior muscle group at thigh level, left thigh, subsequent encounter: Secondary | ICD-10-CM | POA: Diagnosis not present

## 2016-01-08 MED ORDER — METHYLPREDNISOLONE ACETATE 40 MG/ML IJ SUSP
40.0000 mg | Freq: Once | INTRAMUSCULAR | Status: AC
  Administered 2016-01-08: 40 mg via INTRAMUSCULAR

## 2016-01-08 NOTE — Progress Notes (Signed)
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 that has caused her to develop some bursitis around ischial tuberosity of the left leg She did not respond to nitroglycerin She has done her home exercises although initially they were a bit too painful We modified the exercises on her last visit and she has been able to do them better Corticosteroid injection 3 months ago worked well for one month  Toradol injection one month ago gave her significant relief but only for a few days  She has been able to continue exercising with clients However, her left hamstring pain continues to be painful with sitting and with too much activity  Review of systems No sciatic Pain consistently with walking up steps Minimal pain walking down steps Aching type pain during the evenings  Physical examination Muscular older female in no acute distress BP 136/83   Ht 5\' 2"  (1.575 m)   Wt 127 lb (57.6 kg)   BMI 23.23 kg/m   Tenderness to palpation directly over the left ischial tuberosity No defects in the hamstring muscle No discoloration No swelling Strength testing in 3 positions is strong 1 leg standing and reach is still somewhat unsteady on the left Mild pain with diver stretch  Ultrasound of left hamstring insertion The degree of tendon thickening has decreased from 0.7 cm to 0.5 cm There is less calcification noted around it she'll tuberosity There continues to be a bursal type swelling No tear noted  Summary; 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

## 2016-01-08 NOTE — Assessment & Plan Note (Signed)
Procedure:  Injection of Left ischial tuberosity bursa Consent obtained and verified. Time-out conducted. Noted no overlying erythema, induration, or other signs of local infection. Skin prepped in a sterile fashion. Topical analgesic spray: Ethyl chloride. Completed without difficulty. The visualized the bursa on ultrasound. I then directed the needle into the location of greatest swelling and infiltrated this with lidocaine and corticosteroid Meds:1 mL of Solu-Medrol 40 and 4 mL of lidocaine 1% Pain immediately improved suggesting accurate placement of the medication. Advised to call if fevers/chills, erythema, induration, drainage, or persistent bleeding.  Advised rest from her exercises for 3 days after injection Okay to continue walking with her clients Continue doing the exercises but add some modified reverse lunges  She should continue to see some gradual improvement and the appearance of the tendon was improved on ultrasound today  Recheck 6 weeks

## 2016-01-15 DIAGNOSIS — K30 Functional dyspepsia: Secondary | ICD-10-CM | POA: Diagnosis not present

## 2016-02-12 ENCOUNTER — Ambulatory Visit: Payer: BLUE CROSS/BLUE SHIELD | Admitting: Sports Medicine

## 2016-02-26 DIAGNOSIS — C4401 Basal cell carcinoma of skin of lip: Secondary | ICD-10-CM | POA: Diagnosis not present

## 2016-02-26 DIAGNOSIS — D235 Other benign neoplasm of skin of trunk: Secondary | ICD-10-CM | POA: Diagnosis not present

## 2016-02-26 DIAGNOSIS — L821 Other seborrheic keratosis: Secondary | ICD-10-CM | POA: Diagnosis not present

## 2016-02-26 DIAGNOSIS — Z85820 Personal history of malignant melanoma of skin: Secondary | ICD-10-CM | POA: Diagnosis not present

## 2016-02-26 DIAGNOSIS — D1801 Hemangioma of skin and subcutaneous tissue: Secondary | ICD-10-CM | POA: Diagnosis not present

## 2016-02-26 DIAGNOSIS — D485 Neoplasm of uncertain behavior of skin: Secondary | ICD-10-CM | POA: Diagnosis not present

## 2016-02-26 DIAGNOSIS — L570 Actinic keratosis: Secondary | ICD-10-CM | POA: Diagnosis not present

## 2016-03-05 ENCOUNTER — Other Ambulatory Visit: Payer: Self-pay | Admitting: *Deleted

## 2016-03-05 DIAGNOSIS — S76312D Strain of muscle, fascia and tendon of the posterior muscle group at thigh level, left thigh, subsequent encounter: Secondary | ICD-10-CM

## 2016-03-10 DIAGNOSIS — F3342 Major depressive disorder, recurrent, in full remission: Secondary | ICD-10-CM | POA: Diagnosis not present

## 2016-03-11 ENCOUNTER — Ambulatory Visit: Payer: BLUE CROSS/BLUE SHIELD | Admitting: Sports Medicine

## 2016-03-12 ENCOUNTER — Other Ambulatory Visit: Payer: Self-pay | Admitting: Orthopaedic Surgery

## 2016-03-12 ENCOUNTER — Other Ambulatory Visit: Payer: BLUE CROSS/BLUE SHIELD

## 2016-03-12 DIAGNOSIS — M25552 Pain in left hip: Secondary | ICD-10-CM | POA: Diagnosis not present

## 2016-03-17 DIAGNOSIS — C4401 Basal cell carcinoma of skin of lip: Secondary | ICD-10-CM | POA: Diagnosis not present

## 2016-03-18 ENCOUNTER — Ambulatory Visit: Payer: BLUE CROSS/BLUE SHIELD | Admitting: Sports Medicine

## 2016-03-23 ENCOUNTER — Ambulatory Visit
Admission: RE | Admit: 2016-03-23 | Discharge: 2016-03-23 | Disposition: A | Discharge status: Home / Self Care | Payer: BLUE CROSS/BLUE SHIELD | Source: Ambulatory Visit | Attending: Orthopaedic Surgery | Admitting: Orthopaedic Surgery

## 2016-03-23 DIAGNOSIS — M25552 Pain in left hip: Secondary | ICD-10-CM

## 2016-03-23 DIAGNOSIS — S76312A Strain of muscle, fascia and tendon of the posterior muscle group at thigh level, left thigh, initial encounter: Secondary | ICD-10-CM | POA: Diagnosis not present

## 2016-03-24 ENCOUNTER — Other Ambulatory Visit: Payer: BLUE CROSS/BLUE SHIELD

## 2016-03-26 DIAGNOSIS — M76892 Other specified enthesopathies of left lower limb, excluding foot: Secondary | ICD-10-CM | POA: Diagnosis not present

## 2016-04-23 DIAGNOSIS — M76892 Other specified enthesopathies of left lower limb, excluding foot: Secondary | ICD-10-CM | POA: Diagnosis not present

## 2016-04-28 DIAGNOSIS — M76892 Other specified enthesopathies of left lower limb, excluding foot: Secondary | ICD-10-CM | POA: Diagnosis not present

## 2016-05-25 DIAGNOSIS — S76302A Unspecified injury of muscle, fascia and tendon of the posterior muscle group at thigh level, left thigh, initial encounter: Secondary | ICD-10-CM | POA: Diagnosis not present

## 2016-06-10 DIAGNOSIS — M76892 Other specified enthesopathies of left lower limb, excluding foot: Secondary | ICD-10-CM | POA: Diagnosis not present

## 2016-06-10 DIAGNOSIS — M25552 Pain in left hip: Secondary | ICD-10-CM | POA: Diagnosis not present

## 2016-07-01 DIAGNOSIS — M25552 Pain in left hip: Secondary | ICD-10-CM | POA: Diagnosis not present

## 2016-07-14 ENCOUNTER — Other Ambulatory Visit: Payer: Self-pay | Admitting: Family Medicine

## 2016-07-14 DIAGNOSIS — Z1231 Encounter for screening mammogram for malignant neoplasm of breast: Secondary | ICD-10-CM

## 2016-08-05 ENCOUNTER — Ambulatory Visit
Admission: RE | Admit: 2016-08-05 | Discharge: 2016-08-05 | Disposition: A | Discharge status: Home / Self Care | Payer: BLUE CROSS/BLUE SHIELD | Source: Ambulatory Visit | Attending: Family Medicine | Admitting: Family Medicine

## 2016-08-05 DIAGNOSIS — Z1231 Encounter for screening mammogram for malignant neoplasm of breast: Secondary | ICD-10-CM | POA: Diagnosis not present

## 2016-08-17 ENCOUNTER — Ambulatory Visit
Admission: RE | Admit: 2016-08-17 | Discharge: 2016-08-17 | Disposition: A | Discharge status: Home / Self Care | Payer: BLUE CROSS/BLUE SHIELD | Source: Ambulatory Visit | Attending: Family Medicine | Admitting: Family Medicine

## 2016-08-17 ENCOUNTER — Other Ambulatory Visit: Payer: Self-pay | Admitting: Family Medicine

## 2016-08-17 DIAGNOSIS — R109 Unspecified abdominal pain: Secondary | ICD-10-CM

## 2016-08-17 DIAGNOSIS — R1033 Periumbilical pain: Secondary | ICD-10-CM | POA: Diagnosis not present

## 2016-08-18 ENCOUNTER — Other Ambulatory Visit: Payer: Self-pay | Admitting: Family Medicine

## 2016-08-18 DIAGNOSIS — R1084 Generalized abdominal pain: Secondary | ICD-10-CM

## 2016-08-19 ENCOUNTER — Ambulatory Visit
Admission: RE | Admit: 2016-08-19 | Discharge: 2016-08-19 | Disposition: A | Discharge status: Home / Self Care | Payer: BLUE CROSS/BLUE SHIELD | Source: Ambulatory Visit | Attending: Family Medicine | Admitting: Family Medicine

## 2016-08-19 DIAGNOSIS — R1084 Generalized abdominal pain: Secondary | ICD-10-CM

## 2016-08-19 DIAGNOSIS — D259 Leiomyoma of uterus, unspecified: Secondary | ICD-10-CM | POA: Diagnosis not present

## 2016-08-19 DIAGNOSIS — R140 Abdominal distension (gaseous): Secondary | ICD-10-CM | POA: Diagnosis not present

## 2016-08-19 DIAGNOSIS — R10816 Epigastric abdominal tenderness: Secondary | ICD-10-CM | POA: Diagnosis not present

## 2016-08-19 MED ORDER — IOPAMIDOL (ISOVUE-300) INJECTION 61%
100.0000 mL | Freq: Once | INTRAVENOUS | Status: AC | PRN
  Administered 2016-08-19: 100 mL via INTRAVENOUS

## 2016-09-03 DIAGNOSIS — F411 Generalized anxiety disorder: Secondary | ICD-10-CM | POA: Diagnosis not present

## 2016-09-03 DIAGNOSIS — F331 Major depressive disorder, recurrent, moderate: Secondary | ICD-10-CM | POA: Diagnosis not present

## 2016-09-17 ENCOUNTER — Ambulatory Visit (INDEPENDENT_AMBULATORY_CARE_PROVIDER_SITE_OTHER): Payer: BLUE CROSS/BLUE SHIELD | Admitting: Gynecology

## 2016-09-17 ENCOUNTER — Encounter: Payer: Self-pay | Admitting: Gynecology

## 2016-09-17 VITALS — BP 120/70 | Ht 61.0 in | Wt 128.0 lb

## 2016-09-17 DIAGNOSIS — D259 Leiomyoma of uterus, unspecified: Secondary | ICD-10-CM

## 2016-09-17 DIAGNOSIS — Z8262 Family history of osteoporosis: Secondary | ICD-10-CM | POA: Diagnosis not present

## 2016-09-17 DIAGNOSIS — N952 Postmenopausal atrophic vaginitis: Secondary | ICD-10-CM | POA: Diagnosis not present

## 2016-09-17 DIAGNOSIS — Z01411 Encounter for gynecological examination (general) (routine) with abnormal findings: Secondary | ICD-10-CM

## 2016-09-17 NOTE — Progress Notes (Signed)
Carrie [name] September 13, 1955 [id number]        61 y.o.  G0P0000 new patient who had a CT scan done in July 2018 due to bloating and abdominal discomfort. Incidental finding reported under Reproductive: Several lesions associated with the uterus, largest of which is exophytic extending on the left side of the uterine fundus measuring 3.8 x 2.8 x 3.7 cm, presumed multiple fibroids. Ovaries are unremarkable in appearance. Bilateral tubal ligation clips noted.  No history of GYN issues in the past and never told she had fibroids. She has questions about this. Has been seeing family practice routinely for her GYN exams although her last exam was 2015. Has never seen a gynecologist. Pap smear/HPV negative 2015.  Past medical history,surgical history, problem list, medications, allergies, family history and social history were all reviewed and documented as reviewed in the EPIC chart.  ROS:  Performed with pertinent positives and negatives included in the history, assessment and plan.   Additional significant findings :  None   Exam: Caryn Bee assistant Vitals:   09/17/16 1453  BP: 120/70  Weight: 128 lb (58.1 kg)  Height: 5\' 1"  (1.549 m)   Body mass index is 24.19 kg/m.  General appearance:  Normal affect, orientation and appearance. Skin: Grossly normal HEENT: Without gross lesions.  No cervical or supraclavicular adenopathy. Thyroid normal.  Lungs:  Clear without wheezing, rales or rhonchi Cardiac: RR, without RMG Abdominal:  Soft, nontender, without masses, guarding, rebound, organomegaly or hernia Breasts:  Examined lying and sitting without masses, retractions, discharge or axillary adenopathy. Pelvic:  Ext, BUS, Vagina: With atrophic changes  Cervix: With atrophic changes  Uterus: Anteverted mild irregularity but grossly normal in size, midline mobile nontender  Adnexa: Without masses or tenderness    Anus and perineum: Normal   Rectovaginal: Normal sphincter tone without  palpated masses or tenderness.    Assessment/Plan:  61 y.o. G0P0000 female for annual exam.   1. Uterine fibroids.  I reviewed the issues of uterine fibroids. I suspect that she has had these for most of her life and in fact a probably are smaller now that she is postmenopausal. She is totally asymptomatic. There is no question of the diagnosis on CT scan with both ovaries visualized and normal. I discussed whether to do a ultrasound for better definition but I do not think this is going had much at this point. She is comfortable with just following an observing. She's done no bleeding and she will call if she has any bleeding or other issues. 2. Postmenopausal. No significant hot flushes, night sweats, vaginal dryness. 3. Mammography 08/2016. Continue with annual mammography next year. Breast exam normal today. 4. Pap smear/HPV 2015. No Pap smear done today. No history of abnormal Pap smears previously. Plan repeat Pap smear at 5 year interval per current screening guidelines. 5. Colonoscopy 2016. Repeat at their recommended interval. 6. DEXA never.  Discussed whether to do the DEXA now or wait until she is 48. The patient prefers to wait. She's been active her whole life as a runner and eats a healthy diet. I did recommend getting a baseline vitamin D now just to make sure she is within the normal range and she is going to have that drawn today.  She does have a family history of osteoporosis. 7. Health maintenance. No routine lab work done as patient does this elsewhere.  Follow up in one year, sooner as needed.   Anastasio Auerbach MD, 3:12 PM 09/17/2016

## 2016-09-17 NOTE — Patient Instructions (Addendum)
Office will call you with the vitamin D level.

## 2016-09-18 LAB — VITAMIN D 25 HYDROXY (VIT D DEFICIENCY, FRACTURES): Vit D, 25-Hydroxy: 33 ng/mL (ref 30–100)

## 2017-02-25 DIAGNOSIS — F411 Generalized anxiety disorder: Secondary | ICD-10-CM | POA: Diagnosis not present

## 2017-02-25 DIAGNOSIS — F3342 Major depressive disorder, recurrent, in full remission: Secondary | ICD-10-CM | POA: Diagnosis not present

## 2017-04-12 DIAGNOSIS — D225 Melanocytic nevi of trunk: Secondary | ICD-10-CM | POA: Diagnosis not present

## 2017-04-12 DIAGNOSIS — B353 Tinea pedis: Secondary | ICD-10-CM | POA: Diagnosis not present

## 2017-04-12 DIAGNOSIS — L821 Other seborrheic keratosis: Secondary | ICD-10-CM | POA: Diagnosis not present

## 2017-04-12 DIAGNOSIS — Z85820 Personal history of malignant melanoma of skin: Secondary | ICD-10-CM | POA: Diagnosis not present

## 2017-08-19 DIAGNOSIS — F410 Panic disorder [episodic paroxysmal anxiety] without agoraphobia: Secondary | ICD-10-CM | POA: Diagnosis not present

## 2017-08-19 DIAGNOSIS — F3342 Major depressive disorder, recurrent, in full remission: Secondary | ICD-10-CM | POA: Diagnosis not present

## 2017-08-29 ENCOUNTER — Other Ambulatory Visit: Payer: Self-pay | Admitting: Family Medicine

## 2017-08-29 DIAGNOSIS — Z1231 Encounter for screening mammogram for malignant neoplasm of breast: Secondary | ICD-10-CM

## 2017-09-23 ENCOUNTER — Ambulatory Visit
Admission: RE | Admit: 2017-09-23 | Discharge: 2017-09-23 | Disposition: A | Discharge status: Home / Self Care | Payer: BLUE CROSS/BLUE SHIELD | Source: Ambulatory Visit | Attending: Family Medicine | Admitting: Family Medicine

## 2017-09-23 ENCOUNTER — Ambulatory Visit: Payer: BLUE CROSS/BLUE SHIELD

## 2017-09-23 DIAGNOSIS — Z1231 Encounter for screening mammogram for malignant neoplasm of breast: Secondary | ICD-10-CM

## 2017-11-01 ENCOUNTER — Other Ambulatory Visit (HOSPITAL_COMMUNITY): Payer: Self-pay | Admitting: Gastroenterology

## 2017-11-01 DIAGNOSIS — R718 Other abnormality of red blood cells: Secondary | ICD-10-CM | POA: Diagnosis not present

## 2017-11-01 DIAGNOSIS — Z79899 Other long term (current) drug therapy: Secondary | ICD-10-CM | POA: Diagnosis not present

## 2017-11-01 DIAGNOSIS — K219 Gastro-esophageal reflux disease without esophagitis: Secondary | ICD-10-CM | POA: Diagnosis not present

## 2017-11-01 DIAGNOSIS — K3184 Gastroparesis: Secondary | ICD-10-CM | POA: Diagnosis not present

## 2017-11-01 DIAGNOSIS — R110 Nausea: Secondary | ICD-10-CM | POA: Diagnosis not present

## 2017-11-01 DIAGNOSIS — R748 Abnormal levels of other serum enzymes: Secondary | ICD-10-CM | POA: Diagnosis not present

## 2017-11-09 ENCOUNTER — Encounter (HOSPITAL_COMMUNITY): Payer: Self-pay

## 2017-11-09 ENCOUNTER — Encounter (HOSPITAL_COMMUNITY): Payer: BLUE CROSS/BLUE SHIELD

## 2017-12-22 DIAGNOSIS — F339 Major depressive disorder, recurrent, unspecified: Secondary | ICD-10-CM | POA: Diagnosis not present

## 2018-01-05 DIAGNOSIS — S43401A Unspecified sprain of right shoulder joint, initial encounter: Secondary | ICD-10-CM | POA: Diagnosis not present

## 2018-01-05 DIAGNOSIS — M25511 Pain in right shoulder: Secondary | ICD-10-CM | POA: Diagnosis not present

## 2018-01-09 DIAGNOSIS — F4322 Adjustment disorder with anxiety: Secondary | ICD-10-CM | POA: Diagnosis not present

## 2018-01-20 DIAGNOSIS — M25511 Pain in right shoulder: Secondary | ICD-10-CM | POA: Diagnosis not present

## 2018-01-24 DIAGNOSIS — Z9181 History of falling: Secondary | ICD-10-CM | POA: Diagnosis not present

## 2018-01-24 DIAGNOSIS — R600 Localized edema: Secondary | ICD-10-CM | POA: Diagnosis not present

## 2018-01-24 DIAGNOSIS — M7581 Other shoulder lesions, right shoulder: Secondary | ICD-10-CM | POA: Diagnosis not present

## 2018-01-26 ENCOUNTER — Other Ambulatory Visit: Payer: Self-pay | Admitting: Orthopaedic Surgery

## 2018-01-26 ENCOUNTER — Ambulatory Visit
Admission: RE | Admit: 2018-01-26 | Discharge: 2018-01-26 | Disposition: A | Discharge status: Home / Self Care | Payer: BLUE CROSS/BLUE SHIELD | Source: Ambulatory Visit | Attending: Orthopaedic Surgery | Admitting: Orthopaedic Surgery

## 2018-01-26 DIAGNOSIS — M25511 Pain in right shoulder: Secondary | ICD-10-CM

## 2018-01-27 DIAGNOSIS — M25511 Pain in right shoulder: Secondary | ICD-10-CM | POA: Diagnosis not present

## 2018-01-31 DIAGNOSIS — M546 Pain in thoracic spine: Secondary | ICD-10-CM | POA: Diagnosis not present

## 2018-01-31 DIAGNOSIS — L989 Disorder of the skin and subcutaneous tissue, unspecified: Secondary | ICD-10-CM | POA: Diagnosis not present

## 2018-01-31 DIAGNOSIS — D485 Neoplasm of uncertain behavior of skin: Secondary | ICD-10-CM | POA: Diagnosis not present

## 2018-01-31 DIAGNOSIS — L111 Transient acantholytic dermatosis [Grover]: Secondary | ICD-10-CM | POA: Diagnosis not present

## 2018-02-03 DIAGNOSIS — F4321 Adjustment disorder with depressed mood: Secondary | ICD-10-CM | POA: Diagnosis not present

## 2018-02-03 DIAGNOSIS — F3342 Major depressive disorder, recurrent, in full remission: Secondary | ICD-10-CM | POA: Diagnosis not present

## 2018-05-24 DIAGNOSIS — R399 Unspecified symptoms and signs involving the genitourinary system: Secondary | ICD-10-CM | POA: Diagnosis not present

## 2018-05-24 DIAGNOSIS — M791 Myalgia, unspecified site: Secondary | ICD-10-CM | POA: Diagnosis not present

## 2018-05-24 DIAGNOSIS — R5382 Chronic fatigue, unspecified: Secondary | ICD-10-CM | POA: Diagnosis not present

## 2018-07-24 DIAGNOSIS — M545 Low back pain: Secondary | ICD-10-CM | POA: Diagnosis not present

## 2018-08-09 DIAGNOSIS — M545 Low back pain: Secondary | ICD-10-CM | POA: Diagnosis not present

## 2018-09-18 DIAGNOSIS — M533 Sacrococcygeal disorders, not elsewhere classified: Secondary | ICD-10-CM | POA: Diagnosis not present

## 2018-10-04 ENCOUNTER — Other Ambulatory Visit: Payer: Self-pay | Admitting: Family Medicine

## 2018-10-04 DIAGNOSIS — Z1231 Encounter for screening mammogram for malignant neoplasm of breast: Secondary | ICD-10-CM

## 2018-10-06 DIAGNOSIS — M533 Sacrococcygeal disorders, not elsewhere classified: Secondary | ICD-10-CM | POA: Diagnosis not present

## 2018-10-17 DIAGNOSIS — M9905 Segmental and somatic dysfunction of pelvic region: Secondary | ICD-10-CM | POA: Diagnosis not present

## 2018-10-17 DIAGNOSIS — M5442 Lumbago with sciatica, left side: Secondary | ICD-10-CM | POA: Diagnosis not present

## 2018-10-17 DIAGNOSIS — M9903 Segmental and somatic dysfunction of lumbar region: Secondary | ICD-10-CM | POA: Diagnosis not present

## 2018-10-17 DIAGNOSIS — M9902 Segmental and somatic dysfunction of thoracic region: Secondary | ICD-10-CM | POA: Diagnosis not present

## 2018-10-19 DIAGNOSIS — M9905 Segmental and somatic dysfunction of pelvic region: Secondary | ICD-10-CM | POA: Diagnosis not present

## 2018-10-19 DIAGNOSIS — M5442 Lumbago with sciatica, left side: Secondary | ICD-10-CM | POA: Diagnosis not present

## 2018-10-19 DIAGNOSIS — M9903 Segmental and somatic dysfunction of lumbar region: Secondary | ICD-10-CM | POA: Diagnosis not present

## 2018-10-19 DIAGNOSIS — M9902 Segmental and somatic dysfunction of thoracic region: Secondary | ICD-10-CM | POA: Diagnosis not present

## 2018-10-23 DIAGNOSIS — M9905 Segmental and somatic dysfunction of pelvic region: Secondary | ICD-10-CM | POA: Diagnosis not present

## 2018-10-23 DIAGNOSIS — M9903 Segmental and somatic dysfunction of lumbar region: Secondary | ICD-10-CM | POA: Diagnosis not present

## 2018-10-23 DIAGNOSIS — M9902 Segmental and somatic dysfunction of thoracic region: Secondary | ICD-10-CM | POA: Diagnosis not present

## 2018-10-23 DIAGNOSIS — M5442 Lumbago with sciatica, left side: Secondary | ICD-10-CM | POA: Diagnosis not present

## 2018-10-31 DIAGNOSIS — M5442 Lumbago with sciatica, left side: Secondary | ICD-10-CM | POA: Diagnosis not present

## 2018-10-31 DIAGNOSIS — M9902 Segmental and somatic dysfunction of thoracic region: Secondary | ICD-10-CM | POA: Diagnosis not present

## 2018-10-31 DIAGNOSIS — M9905 Segmental and somatic dysfunction of pelvic region: Secondary | ICD-10-CM | POA: Diagnosis not present

## 2018-10-31 DIAGNOSIS — M9903 Segmental and somatic dysfunction of lumbar region: Secondary | ICD-10-CM | POA: Diagnosis not present

## 2018-11-05 IMAGING — MG 2D DIGITAL SCREENING BILATERAL MAMMOGRAM WITH CAD AND ADJUNCT TO
9 of 12 series · 9 of 28 positions shown · non-contrast
Comparison: Previous exam(s).

CLINICAL DATA: Screening.

EXAM:
2D DIGITAL SCREENING BILATERAL MAMMOGRAM WITH CAD AND ADJUNCT TOMO

[R CC]
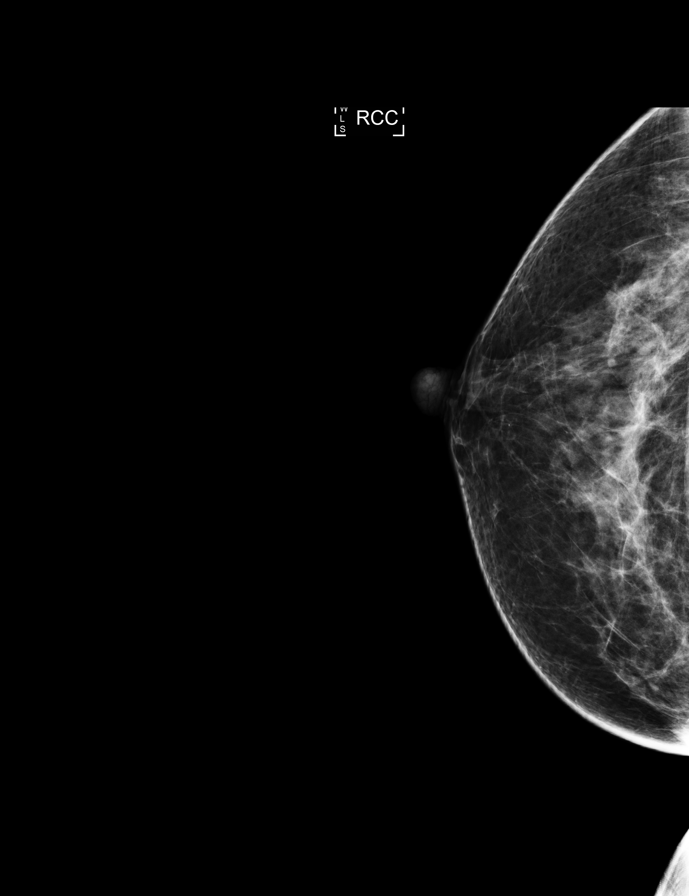

[R CC synth-2D]
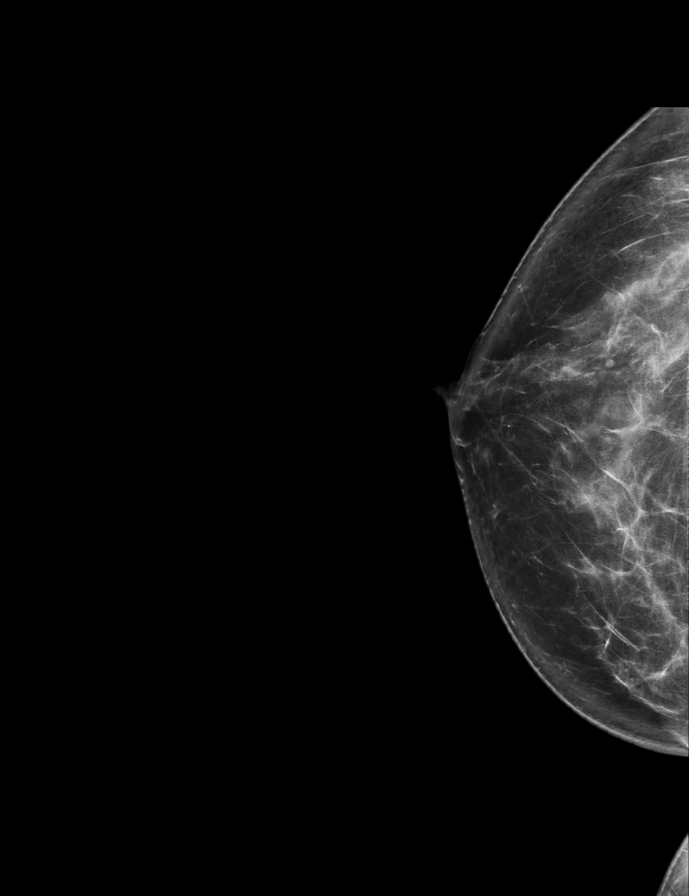

[L MLO]
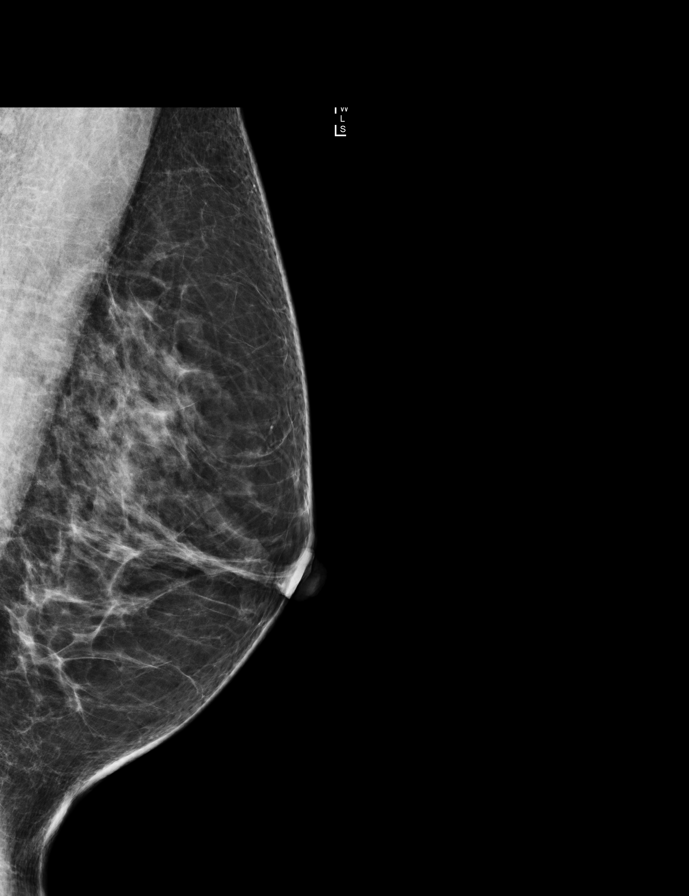

[R MLO synth-2D]
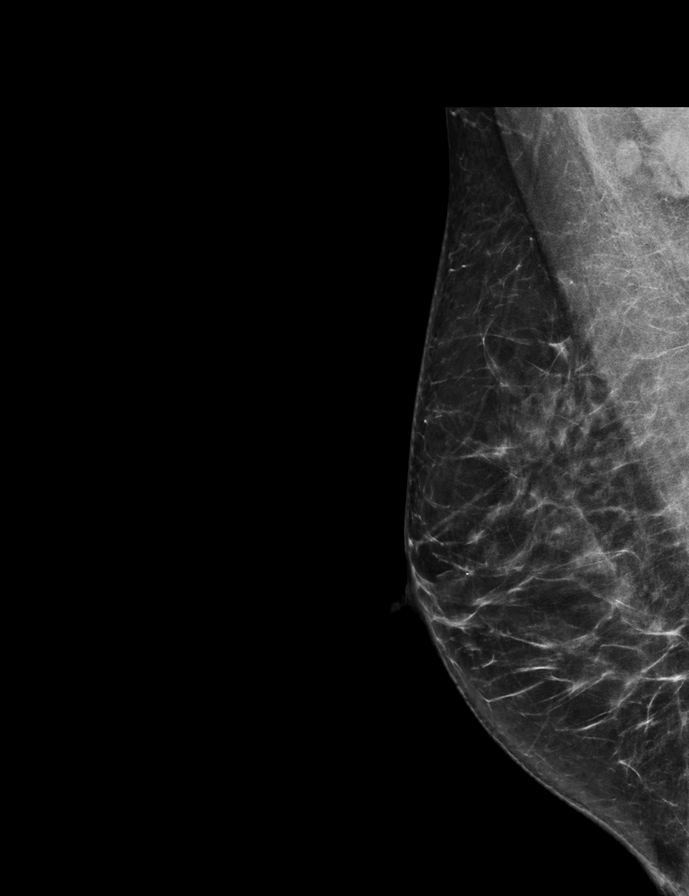

[L CC synth-2D]
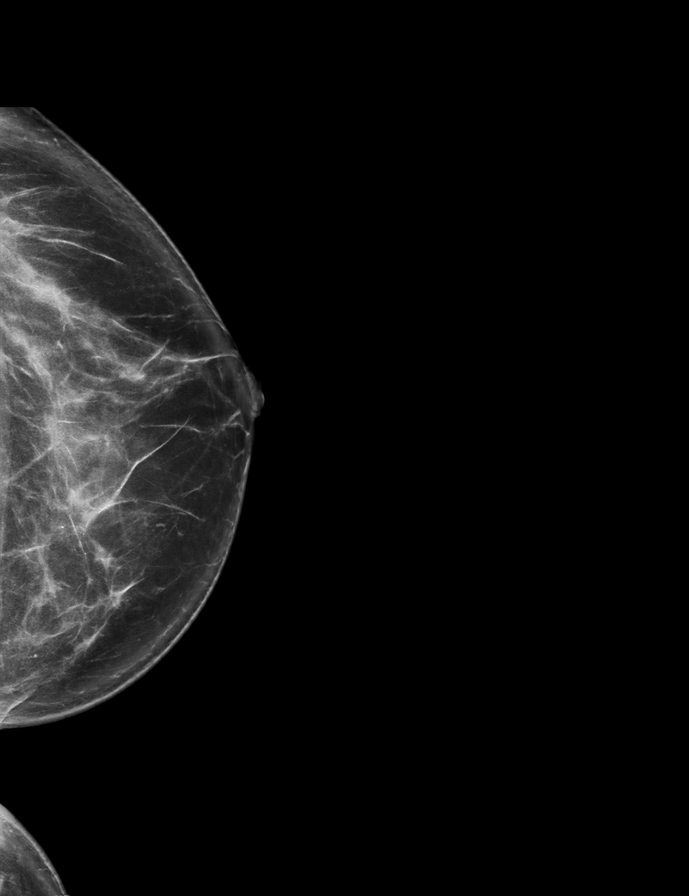

[R MLO]
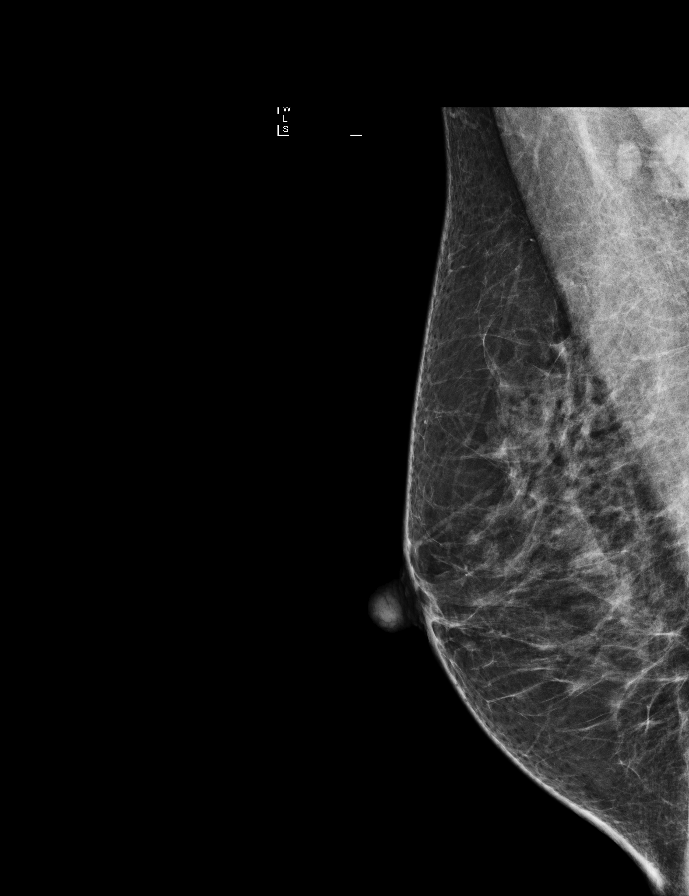

[L CC]
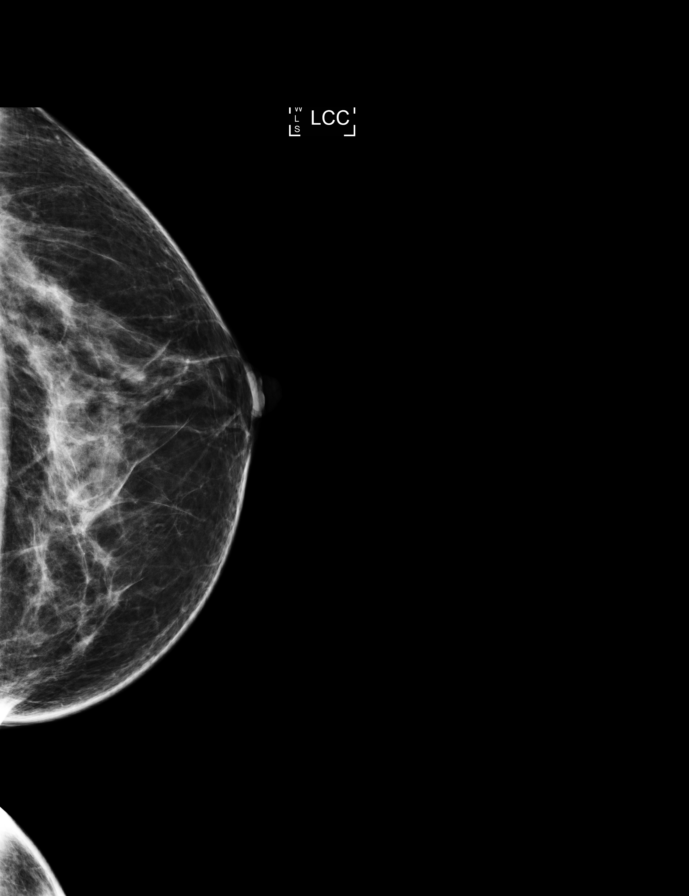

[L MLO synth-2D]
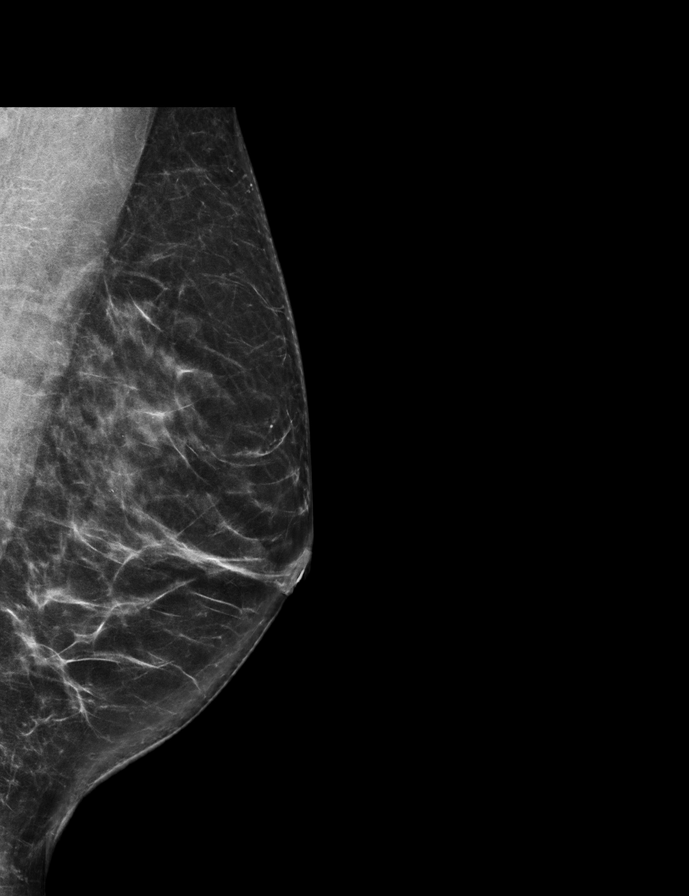

[L MLO tomo · tomo slice 34/67.0]
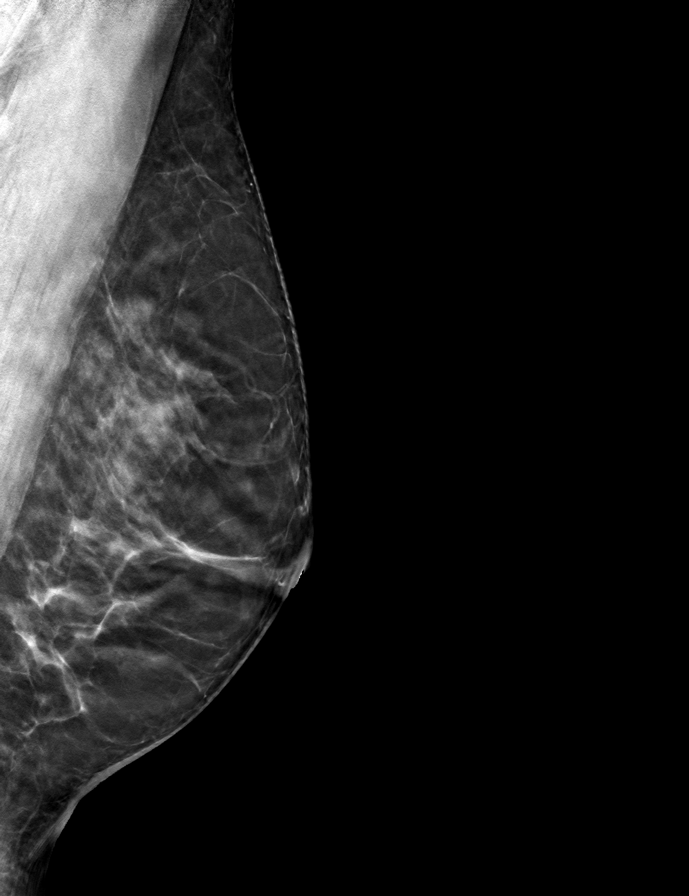

[9 of 28 positions shown; findings below may reference images not displayed]

ACR Breast Density Category b: There are scattered areas of
fibroglandular density.
FINDINGS: There are no findings suspicious for malignancy. Images were
processed with CAD.
IMPRESSION: No mammographic evidence of malignancy. A result letter of this
screening mammogram will be mailed directly to the patient.

RECOMMENDATION:
Screening mammogram in one year. (Code:97-6-RS4)

BI-RADS CATEGORY  1: Negative.

## 2018-11-07 DIAGNOSIS — I10 Essential (primary) hypertension: Secondary | ICD-10-CM | POA: Diagnosis not present

## 2018-11-08 ENCOUNTER — Encounter: Payer: Self-pay | Admitting: Gynecology

## 2018-11-20 ENCOUNTER — Ambulatory Visit: Payer: BLUE CROSS/BLUE SHIELD

## 2018-11-29 DIAGNOSIS — I10 Essential (primary) hypertension: Secondary | ICD-10-CM | POA: Diagnosis not present

## 2018-12-07 ENCOUNTER — Other Ambulatory Visit: Payer: Self-pay

## 2018-12-07 ENCOUNTER — Ambulatory Visit
Admission: RE | Admit: 2018-12-07 | Discharge: 2018-12-07 | Disposition: A | Discharge status: Home / Self Care | Payer: BLUE CROSS/BLUE SHIELD | Source: Ambulatory Visit | Attending: Family Medicine | Admitting: Family Medicine

## 2018-12-07 DIAGNOSIS — Z1231 Encounter for screening mammogram for malignant neoplasm of breast: Secondary | ICD-10-CM

## 2019-02-02 DIAGNOSIS — M25571 Pain in right ankle and joints of right foot: Secondary | ICD-10-CM | POA: Diagnosis not present

## 2019-02-02 DIAGNOSIS — S82841A Displaced bimalleolar fracture of right lower leg, initial encounter for closed fracture: Secondary | ICD-10-CM | POA: Diagnosis not present

## 2019-02-02 DIAGNOSIS — S8251XA Displaced fracture of medial malleolus of right tibia, initial encounter for closed fracture: Secondary | ICD-10-CM | POA: Diagnosis not present

## 2019-02-02 DIAGNOSIS — W19XXXA Unspecified fall, initial encounter: Secondary | ICD-10-CM | POA: Diagnosis not present

## 2019-02-02 DIAGNOSIS — R110 Nausea: Secondary | ICD-10-CM | POA: Diagnosis not present

## 2019-02-02 DIAGNOSIS — S92521A Displaced fracture of medial phalanx of right lesser toe(s), initial encounter for closed fracture: Secondary | ICD-10-CM | POA: Diagnosis not present

## 2019-02-05 DIAGNOSIS — S82841A Displaced bimalleolar fracture of right lower leg, initial encounter for closed fracture: Secondary | ICD-10-CM | POA: Diagnosis not present

## 2019-02-05 DIAGNOSIS — M25571 Pain in right ankle and joints of right foot: Secondary | ICD-10-CM | POA: Diagnosis not present

## 2019-02-05 DIAGNOSIS — M79671 Pain in right foot: Secondary | ICD-10-CM | POA: Diagnosis not present

## 2019-02-07 DIAGNOSIS — S82841A Displaced bimalleolar fracture of right lower leg, initial encounter for closed fracture: Secondary | ICD-10-CM | POA: Diagnosis not present

## 2019-02-20 DIAGNOSIS — S82841A Displaced bimalleolar fracture of right lower leg, initial encounter for closed fracture: Secondary | ICD-10-CM | POA: Diagnosis not present

## 2019-02-20 DIAGNOSIS — Z9889 Other specified postprocedural states: Secondary | ICD-10-CM | POA: Diagnosis not present

## 2019-03-06 DIAGNOSIS — Z9889 Other specified postprocedural states: Secondary | ICD-10-CM | POA: Diagnosis not present

## 2019-03-29 DIAGNOSIS — S82841D Displaced bimalleolar fracture of right lower leg, subsequent encounter for closed fracture with routine healing: Secondary | ICD-10-CM | POA: Diagnosis not present

## 2019-03-29 DIAGNOSIS — Z9889 Other specified postprocedural states: Secondary | ICD-10-CM | POA: Diagnosis not present

## 2019-03-29 DIAGNOSIS — M79671 Pain in right foot: Secondary | ICD-10-CM | POA: Diagnosis not present

## 2019-05-15 DIAGNOSIS — Z09 Encounter for follow-up examination after completed treatment for conditions other than malignant neoplasm: Secondary | ICD-10-CM | POA: Diagnosis not present

## 2019-05-15 DIAGNOSIS — M79671 Pain in right foot: Secondary | ICD-10-CM | POA: Diagnosis not present

## 2019-05-17 DIAGNOSIS — Z0120 Encounter for dental examination and cleaning without abnormal findings: Secondary | ICD-10-CM | POA: Diagnosis not present

## 2019-05-25 DIAGNOSIS — W540XXA Bitten by dog, initial encounter: Secondary | ICD-10-CM | POA: Diagnosis not present

## 2019-05-25 DIAGNOSIS — S61451A Open bite of right hand, initial encounter: Secondary | ICD-10-CM | POA: Diagnosis not present

## 2019-05-25 DIAGNOSIS — L03113 Cellulitis of right upper limb: Secondary | ICD-10-CM | POA: Diagnosis not present

## 2019-05-25 DIAGNOSIS — Z23 Encounter for immunization: Secondary | ICD-10-CM | POA: Diagnosis not present

## 2019-06-25 DIAGNOSIS — H04123 Dry eye syndrome of bilateral lacrimal glands: Secondary | ICD-10-CM | POA: Diagnosis not present

## 2019-07-03 DIAGNOSIS — M25571 Pain in right ankle and joints of right foot: Secondary | ICD-10-CM | POA: Diagnosis not present

## 2019-07-12 DIAGNOSIS — H04123 Dry eye syndrome of bilateral lacrimal glands: Secondary | ICD-10-CM | POA: Diagnosis not present

## 2019-08-23 DIAGNOSIS — T8484XA Pain due to internal orthopedic prosthetic devices, implants and grafts, initial encounter: Secondary | ICD-10-CM | POA: Diagnosis not present

## 2019-08-23 DIAGNOSIS — Z09 Encounter for follow-up examination after completed treatment for conditions other than malignant neoplasm: Secondary | ICD-10-CM | POA: Diagnosis not present

## 2019-08-23 DIAGNOSIS — M25571 Pain in right ankle and joints of right foot: Secondary | ICD-10-CM | POA: Diagnosis not present

## 2019-10-09 DIAGNOSIS — R221 Localized swelling, mass and lump, neck: Secondary | ICD-10-CM | POA: Diagnosis not present

## 2019-10-09 DIAGNOSIS — D7589 Other specified diseases of blood and blood-forming organs: Secondary | ICD-10-CM | POA: Diagnosis not present

## 2019-10-09 DIAGNOSIS — I10 Essential (primary) hypertension: Secondary | ICD-10-CM | POA: Diagnosis not present

## 2019-10-09 DIAGNOSIS — R29810 Facial weakness: Secondary | ICD-10-CM | POA: Diagnosis not present

## 2019-10-10 ENCOUNTER — Other Ambulatory Visit: Payer: Self-pay | Admitting: Family Medicine

## 2019-10-10 DIAGNOSIS — R29810 Facial weakness: Secondary | ICD-10-CM

## 2019-10-12 DIAGNOSIS — R599 Enlarged lymph nodes, unspecified: Secondary | ICD-10-CM | POA: Diagnosis not present

## 2019-10-17 DIAGNOSIS — T50Z95D Adverse effect of other vaccines and biological substances, subsequent encounter: Secondary | ICD-10-CM | POA: Diagnosis not present

## 2019-10-17 DIAGNOSIS — R599 Enlarged lymph nodes, unspecified: Secondary | ICD-10-CM | POA: Diagnosis not present

## 2019-10-29 ENCOUNTER — Other Ambulatory Visit: Payer: Self-pay | Admitting: Family Medicine

## 2019-10-29 DIAGNOSIS — Z1231 Encounter for screening mammogram for malignant neoplasm of breast: Secondary | ICD-10-CM

## 2019-11-19 DIAGNOSIS — Z5181 Encounter for therapeutic drug level monitoring: Secondary | ICD-10-CM | POA: Diagnosis not present

## 2019-11-23 DIAGNOSIS — K134 Granuloma and granuloma-like lesions of oral mucosa: Secondary | ICD-10-CM | POA: Diagnosis not present

## 2019-12-11 ENCOUNTER — Ambulatory Visit: Payer: Medicare Other

## 2020-01-09 DIAGNOSIS — Z0120 Encounter for dental examination and cleaning without abnormal findings: Secondary | ICD-10-CM | POA: Diagnosis not present

## 2020-01-18 ENCOUNTER — Other Ambulatory Visit: Payer: Self-pay

## 2020-01-18 ENCOUNTER — Ambulatory Visit
Admission: RE | Admit: 2020-01-18 | Discharge: 2020-01-18 | Disposition: A | Discharge status: Home / Self Care | Payer: Medicare Other | Source: Ambulatory Visit | Attending: Family Medicine | Admitting: Family Medicine

## 2020-01-18 DIAGNOSIS — Z1231 Encounter for screening mammogram for malignant neoplasm of breast: Secondary | ICD-10-CM | POA: Diagnosis not present

## 2020-01-23 DIAGNOSIS — R399 Unspecified symptoms and signs involving the genitourinary system: Secondary | ICD-10-CM | POA: Diagnosis not present

## 2020-01-23 DIAGNOSIS — I10 Essential (primary) hypertension: Secondary | ICD-10-CM | POA: Diagnosis not present

## 2020-01-23 DIAGNOSIS — Z5181 Encounter for therapeutic drug level monitoring: Secondary | ICD-10-CM | POA: Diagnosis not present

## 2020-01-24 ENCOUNTER — Telehealth: Payer: Self-pay

## 2020-01-31 ENCOUNTER — Telehealth: Payer: Self-pay | Admitting: Gastroenterology

## 2020-01-31 NOTE — Telephone Encounter (Addendum)
Hi Dr. Orvan Falconer,  We received records from previous GI provider patient is requesting to transfer care over to [facility] from [facility].   Please advise on scheduling.   Thank you

## 2020-02-06 NOTE — Telephone Encounter (Signed)
25 pages of records reviewed from [facility] physicians.  Previously followed by Dr. Levora Angel for gastroparesis and abdominal distention.  She has chronic nausea.  Diagnosed with gastroparesis based on a gastric emptying study in 2005.  Has been on Reglan for at least 15 years.  Was seen in 2019 for worsening abdominal distention with epigastric and periumbilical abdominal pain and eructation. She was started on pantoprazole which significantly improved her symptoms.  However, pantoprazole cause constipation.  She has been on Phenergan although this makes her lethargic and Zofran for her nausea.  She also has a history of a minimally elevated lipase.  A CT scan did not show any changes.  Last 11/01/2017 for elevated MCV.   Previous GI work-up -Colonoscopy 2016 showed benign prolapse polyp in the rectum, internal and external hemorrhoids, surveillance recommended in 10 years -CT abdomen and pelvis with IV contrast 08/2016 showed no acute findings, uterine fibroids -Gastric emptying scan 2005+ for delayed emptying -HIDA scan recommended in 2019 -Normal liver enzymes 2019, lipase 75 with follow-up level 45  Records mention prior colonoscopy in 2005 as well as EGD in 2005 and 2002  Concurrent medical history: Hypertension, depression Medications include Cymbalta, dicyclomine, magnesium, fish oil, pantoprazole, Phenergan, Proctofoam, Reglan, Wellbutrin, Xanax  I agreed to see the patient in consultation with the caveat that she understand prior to scheduling that I do not routinely prescribe longterm Reglan. If that is her wish, she would be best served seeing one of the gastroparesis specialists at [facility].

## 2020-02-13 ENCOUNTER — Telehealth: Payer: Self-pay | Admitting: Gastroenterology

## 2020-02-13 NOTE — Telephone Encounter (Signed)
Called patient to schedule and advise her of Dr. Tarri Glenn recommendation left voicemail

## 2020-03-13 DIAGNOSIS — Z85828 Personal history of other malignant neoplasm of skin: Secondary | ICD-10-CM | POA: Diagnosis not present

## 2020-03-13 DIAGNOSIS — L814 Other melanin hyperpigmentation: Secondary | ICD-10-CM | POA: Diagnosis not present

## 2020-03-13 DIAGNOSIS — Z85820 Personal history of malignant melanoma of skin: Secondary | ICD-10-CM | POA: Diagnosis not present

## 2020-03-13 DIAGNOSIS — D1801 Hemangioma of skin and subcutaneous tissue: Secondary | ICD-10-CM | POA: Diagnosis not present

## 2020-03-13 DIAGNOSIS — C44319 Basal cell carcinoma of skin of other parts of face: Secondary | ICD-10-CM | POA: Diagnosis not present

## 2020-03-13 DIAGNOSIS — C44519 Basal cell carcinoma of skin of other part of trunk: Secondary | ICD-10-CM | POA: Diagnosis not present

## 2020-03-13 DIAGNOSIS — L905 Scar conditions and fibrosis of skin: Secondary | ICD-10-CM | POA: Diagnosis not present

## 2020-03-20 DIAGNOSIS — C44612 Basal cell carcinoma of skin of right upper limb, including shoulder: Secondary | ICD-10-CM | POA: Diagnosis not present

## 2020-04-08 DIAGNOSIS — C44319 Basal cell carcinoma of skin of other parts of face: Secondary | ICD-10-CM | POA: Diagnosis not present

## 2020-07-08 DIAGNOSIS — D2339 Other benign neoplasm of skin of other parts of face: Secondary | ICD-10-CM | POA: Diagnosis not present

## 2020-07-08 DIAGNOSIS — L821 Other seborrheic keratosis: Secondary | ICD-10-CM | POA: Diagnosis not present

## 2020-07-08 DIAGNOSIS — D225 Melanocytic nevi of trunk: Secondary | ICD-10-CM | POA: Diagnosis not present

## 2020-07-08 DIAGNOSIS — L905 Scar conditions and fibrosis of skin: Secondary | ICD-10-CM | POA: Diagnosis not present

## 2020-07-08 DIAGNOSIS — D1801 Hemangioma of skin and subcutaneous tissue: Secondary | ICD-10-CM | POA: Diagnosis not present

## 2020-07-15 DIAGNOSIS — I10 Essential (primary) hypertension: Secondary | ICD-10-CM | POA: Diagnosis not present

## 2020-07-15 DIAGNOSIS — Z5181 Encounter for therapeutic drug level monitoring: Secondary | ICD-10-CM | POA: Diagnosis not present

## 2020-07-15 DIAGNOSIS — Z13220 Encounter for screening for lipoid disorders: Secondary | ICD-10-CM | POA: Diagnosis not present

## 2020-07-18 DIAGNOSIS — Z0000 Encounter for general adult medical examination without abnormal findings: Secondary | ICD-10-CM | POA: Diagnosis not present

## 2020-07-18 DIAGNOSIS — I10 Essential (primary) hypertension: Secondary | ICD-10-CM | POA: Diagnosis not present

## 2020-07-18 DIAGNOSIS — Z23 Encounter for immunization: Secondary | ICD-10-CM | POA: Diagnosis not present

## 2020-07-23 ENCOUNTER — Other Ambulatory Visit: Payer: Self-pay | Admitting: Family Medicine

## 2020-07-23 DIAGNOSIS — E2839 Other primary ovarian failure: Secondary | ICD-10-CM

## 2020-08-01 DIAGNOSIS — H04123 Dry eye syndrome of bilateral lacrimal glands: Secondary | ICD-10-CM | POA: Diagnosis not present

## 2020-10-17 DIAGNOSIS — Z23 Encounter for immunization: Secondary | ICD-10-CM | POA: Diagnosis not present

## 2020-10-23 DIAGNOSIS — L570 Actinic keratosis: Secondary | ICD-10-CM | POA: Diagnosis not present

## 2020-12-02 DIAGNOSIS — M25571 Pain in right ankle and joints of right foot: Secondary | ICD-10-CM | POA: Diagnosis not present

## 2020-12-04 ENCOUNTER — Other Ambulatory Visit: Payer: Self-pay

## 2020-12-04 ENCOUNTER — Other Ambulatory Visit: Payer: Self-pay | Admitting: Family Medicine

## 2020-12-04 DIAGNOSIS — Z1231 Encounter for screening mammogram for malignant neoplasm of breast: Secondary | ICD-10-CM

## 2020-12-10 DIAGNOSIS — T8484XA Pain due to internal orthopedic prosthetic devices, implants and grafts, initial encounter: Secondary | ICD-10-CM | POA: Diagnosis not present

## 2020-12-10 DIAGNOSIS — Z472 Encounter for removal of internal fixation device: Secondary | ICD-10-CM | POA: Diagnosis not present

## 2020-12-10 DIAGNOSIS — S82841S Displaced bimalleolar fracture of right lower leg, sequela: Secondary | ICD-10-CM | POA: Diagnosis not present

## 2020-12-10 DIAGNOSIS — G8918 Other acute postprocedural pain: Secondary | ICD-10-CM | POA: Diagnosis not present

## 2021-01-19 ENCOUNTER — Other Ambulatory Visit: Payer: Medicare Other

## 2021-01-20 ENCOUNTER — Ambulatory Visit
Admission: RE | Admit: 2021-01-20 | Discharge: 2021-01-20 | Disposition: A | Discharge status: Home / Self Care | Payer: Medicare Other | Source: Ambulatory Visit | Attending: Family Medicine | Admitting: Family Medicine

## 2021-01-20 DIAGNOSIS — Z1231 Encounter for screening mammogram for malignant neoplasm of breast: Secondary | ICD-10-CM | POA: Diagnosis not present

## 2021-02-12 DIAGNOSIS — R5383 Other fatigue: Secondary | ICD-10-CM | POA: Diagnosis not present

## 2021-03-02 DIAGNOSIS — I10 Essential (primary) hypertension: Secondary | ICD-10-CM | POA: Diagnosis not present

## 2021-03-02 DIAGNOSIS — K3184 Gastroparesis: Secondary | ICD-10-CM | POA: Diagnosis not present

## 2021-03-02 DIAGNOSIS — K30 Functional dyspepsia: Secondary | ICD-10-CM | POA: Diagnosis not present

## 2021-03-24 DIAGNOSIS — M545 Low back pain, unspecified: Secondary | ICD-10-CM | POA: Diagnosis not present

## 2021-04-15 DIAGNOSIS — N950 Postmenopausal bleeding: Secondary | ICD-10-CM | POA: Diagnosis not present

## 2021-04-15 DIAGNOSIS — Z79899 Other long term (current) drug therapy: Secondary | ICD-10-CM | POA: Diagnosis not present

## 2021-04-16 ENCOUNTER — Ambulatory Visit: Payer: Medicare Other | Admitting: Obstetrics & Gynecology

## 2021-04-16 ENCOUNTER — Other Ambulatory Visit (HOSPITAL_COMMUNITY)
Admission: RE | Admit: 2021-04-16 | Discharge: 2021-04-16 | Disposition: A | Discharge status: Home / Self Care | Payer: Medicare Other | Source: Ambulatory Visit | Attending: Obstetrics & Gynecology | Admitting: Obstetrics & Gynecology

## 2021-04-16 ENCOUNTER — Encounter: Payer: Self-pay | Admitting: Obstetrics & Gynecology

## 2021-04-16 ENCOUNTER — Other Ambulatory Visit: Payer: Self-pay

## 2021-04-16 VITALS — BP 134/72 | HR 77 | Ht 61.0 in | Wt 135.0 lb

## 2021-04-16 DIAGNOSIS — Z1151 Encounter for screening for human papillomavirus (HPV): Secondary | ICD-10-CM | POA: Diagnosis not present

## 2021-04-16 DIAGNOSIS — N950 Postmenopausal bleeding: Secondary | ICD-10-CM | POA: Diagnosis not present

## 2021-04-16 DIAGNOSIS — Z01419 Encounter for gynecological examination (general) (routine) without abnormal findings: Secondary | ICD-10-CM | POA: Insufficient documentation

## 2021-04-16 NOTE — Progress Notes (Signed)
? ? ?  Carrie [name] 1955/04/27 [id number] ? ? ?     66 y.o.  G0P0000  ? ?RP: PMB x 2 days ? ?HPI: Postmenopause on no HRT.  PMB x 2 days on Sunday and Monday 3/12-13th.  Not bleeding currently.  No pelvic pain.  No h/o abnormal Pap.  Abstinent x 2007. ? ? ?OB History  ?Gravida Para Term Preterm AB Living  ?0 0 0 0 0 0  ?SAB IAB Ectopic Multiple Live Births  ?0 0 0 0 0  ? ? ?Past medical history,surgical history, problem list, medications, allergies, family history and social history were all reviewed and documented in the EPIC chart. ? ? ?Directed ROS with pertinent positives and negatives documented in the history of present illness/assessment and plan. ? ?Exam: ? ?Vitals:  ? 04/16/21 1529  ?BP: 134/72  ?Pulse: 77  ?SpO2: 99%  ?Weight: 135 lb (61.2 kg)  ?Height: '5\' 1"'$  (1.549 m)  ? ?General appearance:  Normal ? ?Abdomen: Normal ? ?Gynecologic exam: Vulva normal.  Speculum:  Cervix/Vagina normal.  Pap reflex done.  Bimanual exam:  AV uterus, normal volume, mobile, NT.  No adnexal mass, NT. ? ? ?Assessment/Plan:  66 y.o. G0 ? ?1. Postmenopausal bleeding ?Postmenopause on no HRT.  PMB x 2 days on Sunday and Monday 3/12-13th.  Not bleeding currently.  No pelvic pain.  No h/o abnormal Pap.  Abstinent x 2007.  Normal gynecology exam.  No blood seen in the vagina or at the cervix.  Pap test done.  Counseling on PMB done.  Will f/u with a Pelvic US to further investigate.   ?- Cytology - PAP( [place]) ?- US Transvaginal Non-OB; Future ? ?Other orders ?- pravastatin (PRAVACHOL) 10 MG tablet; Take 10 mg by mouth daily. ?- losartan (COZAAR) 100 MG tablet; Take 100 mg by mouth daily. ?- DULoxetine (CYMBALTA) 60 MG capsule; Take 60 mg by mouth daily.  ? ?Princess Bruins MD, 3:50 PM 04/16/2021 ? ? ? ?  ?

## 2021-04-21 ENCOUNTER — Other Ambulatory Visit: Payer: Medicare Other | Admitting: Obstetrics & Gynecology

## 2021-04-21 ENCOUNTER — Ambulatory Visit (INDEPENDENT_AMBULATORY_CARE_PROVIDER_SITE_OTHER): Payer: Medicare Other

## 2021-04-21 ENCOUNTER — Encounter: Payer: Self-pay | Admitting: Obstetrics & Gynecology

## 2021-04-21 ENCOUNTER — Ambulatory Visit: Payer: Medicare Other | Admitting: Obstetrics & Gynecology

## 2021-04-21 ENCOUNTER — Other Ambulatory Visit: Payer: Self-pay

## 2021-04-21 DIAGNOSIS — N950 Postmenopausal bleeding: Secondary | ICD-10-CM

## 2021-04-21 NOTE — Progress Notes (Signed)
? ? ?  Carrie [name] Aug 10, 1955 [id number] ? ? ?     66 y.o.  G0P0000  ? ?RP: PMB x 2 days 3/12-13th ?  ?HPI: Postmenopause on no HRT.  PMB x 2 days on Sunday and Monday 3/12-13th.  Not bleeding since then.  No pelvic pain.  No h/o abnormal Pap, Pap pending from 04/16/21.  Abstinent x 2007. ?  ? ? ?OB History  ?Gravida Para Term Preterm AB Living  ?0 0 0 0 0 0  ?SAB IAB Ectopic Multiple Live Births  ?0 0 0 0 0  ? ? ?Past medical history,surgical history, problem list, medications, allergies, family history and social history were all reviewed and documented in the EPIC chart. ? ? ?Directed ROS with pertinent positives and negatives documented in the history of present illness/assessment and plan. ? ?Exam: ? ?There were no vitals filed for this visit. ?General appearance:  Normal ? ?Pelvic US today: T/V images.  Anteverted uterus normal in size and shape measured at 6.41 x 4.67 x 1.96 cm.  A right lateral anterior fundal fibroid is seen measuring 2.6 x 2.2 cm.  The endometrial lining is avascular, thin and symmetrical measuring approximately 2.97 mm.  Intracavitary fluid with debris's is seen.  No obvious mass or thickening seen.  Both ovaries are atrophic in appearance.  No adnexal mass.  No free fluid in the pelvis. ? ? ?Assessment/Plan:  66 y.o. G0P0000  ? ?1. Postmenopausal bleeding  ?Postmenopause on no HRT.  PMB x 2 days on Sunday and Monday 3/12-13th.  Not bleeding since then.  No pelvic pain.  No h/o abnormal Pap, Pap pending from 04/16/21.  Abstinent x 2007. ?Pelvic US findings thoroughly reviewed with patient.  Thin endometrial line measured at 2.97 mm, no vascularity, no mass or thickening.  Uterus and ovaries normal.  No adnexal mass.  No FF in the pelvis.  Counseling on PMB.  Precautions reviewed.   ? ?Princess Bruins MD, 12:01 PM 04/21/2021 ? ? ? ?  ?

## 2021-04-23 ENCOUNTER — Encounter: Payer: Self-pay | Admitting: Obstetrics & Gynecology

## 2021-04-24 LAB — CYTOLOGY - PAP
Comment: NEGATIVE
High risk HPV: NEGATIVE

## 2021-04-26 ENCOUNTER — Encounter: Payer: Self-pay | Admitting: Obstetrics & Gynecology

## 2021-04-27 ENCOUNTER — Encounter: Payer: Self-pay | Admitting: Obstetrics & Gynecology

## 2021-04-27 ENCOUNTER — Telehealth: Payer: Self-pay

## 2021-04-27 NOTE — Telephone Encounter (Signed)
Patient had contacted [place] through My Chart with much concern about her Pap smear result. I printed it and went back and spoke with Dr. Dellis Filbert about it. Dr. Dellis Filbert advised to reassure patient no malignancy or precancer.  Atrophic cells are common among post menopausal women and the atypia is sometimes associated. Very reassuring that the high risk HPV result is negative.  Recommend repeat Pap smear in one year. Recall placed.  I called and spoke with patient and advised her of all this.  She voiced understanding. ?

## 2021-09-25 DIAGNOSIS — I10 Essential (primary) hypertension: Secondary | ICD-10-CM | POA: Diagnosis not present

## 2021-09-25 DIAGNOSIS — S92414A Nondisplaced fracture of proximal phalanx of right great toe, initial encounter for closed fracture: Secondary | ICD-10-CM | POA: Diagnosis not present

## 2021-10-02 DIAGNOSIS — D7589 Other specified diseases of blood and blood-forming organs: Secondary | ICD-10-CM | POA: Diagnosis not present

## 2021-10-02 DIAGNOSIS — E7800 Pure hypercholesterolemia, unspecified: Secondary | ICD-10-CM | POA: Diagnosis not present

## 2021-10-02 DIAGNOSIS — R7301 Impaired fasting glucose: Secondary | ICD-10-CM | POA: Diagnosis not present

## 2021-10-02 DIAGNOSIS — I10 Essential (primary) hypertension: Secondary | ICD-10-CM | POA: Diagnosis not present

## 2021-10-06 DIAGNOSIS — I10 Essential (primary) hypertension: Secondary | ICD-10-CM | POA: Diagnosis not present

## 2021-10-06 DIAGNOSIS — D7589 Other specified diseases of blood and blood-forming organs: Secondary | ICD-10-CM | POA: Diagnosis not present

## 2021-10-06 DIAGNOSIS — E7800 Pure hypercholesterolemia, unspecified: Secondary | ICD-10-CM | POA: Diagnosis not present

## 2021-10-06 DIAGNOSIS — Z0000 Encounter for general adult medical examination without abnormal findings: Secondary | ICD-10-CM | POA: Diagnosis not present

## 2021-10-13 DIAGNOSIS — M79671 Pain in right foot: Secondary | ICD-10-CM | POA: Diagnosis not present

## 2021-10-14 ENCOUNTER — Other Ambulatory Visit: Payer: Self-pay | Admitting: Family Medicine

## 2021-10-14 DIAGNOSIS — E7800 Pure hypercholesterolemia, unspecified: Secondary | ICD-10-CM

## 2021-11-30 DIAGNOSIS — M5451 Vertebrogenic low back pain: Secondary | ICD-10-CM | POA: Diagnosis not present

## 2021-12-08 ENCOUNTER — Other Ambulatory Visit: Payer: Self-pay | Admitting: Family Medicine

## 2021-12-08 DIAGNOSIS — Z1231 Encounter for screening mammogram for malignant neoplasm of breast: Secondary | ICD-10-CM

## 2022-01-12 ENCOUNTER — Other Ambulatory Visit: Payer: Medicare Other

## 2022-01-12 ENCOUNTER — Ambulatory Visit
Admission: RE | Admit: 2022-01-12 | Discharge: 2022-01-12 | Disposition: A | Discharge status: Home / Self Care | Payer: No Typology Code available for payment source | Source: Ambulatory Visit | Attending: Family Medicine | Admitting: Family Medicine

## 2022-01-12 DIAGNOSIS — E7800 Pure hypercholesterolemia, unspecified: Secondary | ICD-10-CM

## 2022-02-08 ENCOUNTER — Ambulatory Visit
Admission: RE | Admit: 2022-02-08 | Discharge: 2022-02-08 | Disposition: A | Discharge status: Home / Self Care | Payer: BLUE CROSS/BLUE SHIELD | Source: Ambulatory Visit | Attending: Family Medicine | Admitting: Family Medicine

## 2022-02-08 DIAGNOSIS — Z1231 Encounter for screening mammogram for malignant neoplasm of breast: Secondary | ICD-10-CM

## 2022-03-23 DIAGNOSIS — K080 Exfoliation of teeth due to systemic causes: Secondary | ICD-10-CM | POA: Diagnosis not present

## 2022-04-20 DIAGNOSIS — H6992 Unspecified Eustachian tube disorder, left ear: Secondary | ICD-10-CM | POA: Diagnosis not present

## 2022-04-20 DIAGNOSIS — I10 Essential (primary) hypertension: Secondary | ICD-10-CM | POA: Diagnosis not present

## 2022-06-21 DIAGNOSIS — H04123 Dry eye syndrome of bilateral lacrimal glands: Secondary | ICD-10-CM | POA: Diagnosis not present

## 2022-07-29 DIAGNOSIS — K080 Exfoliation of teeth due to systemic causes: Secondary | ICD-10-CM | POA: Diagnosis not present

## 2022-08-04 DIAGNOSIS — K080 Exfoliation of teeth due to systemic causes: Secondary | ICD-10-CM | POA: Diagnosis not present

## 2022-08-11 DIAGNOSIS — I10 Essential (primary) hypertension: Secondary | ICD-10-CM | POA: Diagnosis not present

## 2022-09-02 DIAGNOSIS — I10 Essential (primary) hypertension: Secondary | ICD-10-CM | POA: Diagnosis not present

## 2022-11-01 DIAGNOSIS — X32XXXA Exposure to sunlight, initial encounter: Secondary | ICD-10-CM | POA: Diagnosis not present

## 2022-11-01 DIAGNOSIS — C44321 Squamous cell carcinoma of skin of nose: Secondary | ICD-10-CM | POA: Diagnosis not present

## 2022-11-01 DIAGNOSIS — L570 Actinic keratosis: Secondary | ICD-10-CM | POA: Diagnosis not present

## 2022-11-01 DIAGNOSIS — L814 Other melanin hyperpigmentation: Secondary | ICD-10-CM | POA: Diagnosis not present

## 2022-11-01 DIAGNOSIS — D485 Neoplasm of uncertain behavior of skin: Secondary | ICD-10-CM | POA: Diagnosis not present

## 2022-11-01 DIAGNOSIS — L821 Other seborrheic keratosis: Secondary | ICD-10-CM | POA: Diagnosis not present

## 2022-11-01 DIAGNOSIS — D225 Melanocytic nevi of trunk: Secondary | ICD-10-CM | POA: Diagnosis not present

## 2022-11-01 DIAGNOSIS — B353 Tinea pedis: Secondary | ICD-10-CM | POA: Diagnosis not present

## 2022-11-08 DIAGNOSIS — D7589 Other specified diseases of blood and blood-forming organs: Secondary | ICD-10-CM | POA: Diagnosis not present

## 2022-11-08 DIAGNOSIS — E7800 Pure hypercholesterolemia, unspecified: Secondary | ICD-10-CM | POA: Diagnosis not present

## 2022-11-08 DIAGNOSIS — R718 Other abnormality of red blood cells: Secondary | ICD-10-CM | POA: Diagnosis not present

## 2022-11-08 DIAGNOSIS — I10 Essential (primary) hypertension: Secondary | ICD-10-CM | POA: Diagnosis not present

## 2022-11-08 DIAGNOSIS — R7301 Impaired fasting glucose: Secondary | ICD-10-CM | POA: Diagnosis not present

## 2022-11-09 DIAGNOSIS — C44321 Squamous cell carcinoma of skin of nose: Secondary | ICD-10-CM | POA: Diagnosis not present

## 2022-12-01 ENCOUNTER — Other Ambulatory Visit: Payer: Self-pay | Admitting: Family Medicine

## 2022-12-01 DIAGNOSIS — I10 Essential (primary) hypertension: Secondary | ICD-10-CM | POA: Diagnosis not present

## 2022-12-01 DIAGNOSIS — E2839 Other primary ovarian failure: Secondary | ICD-10-CM

## 2022-12-01 DIAGNOSIS — E538 Deficiency of other specified B group vitamins: Secondary | ICD-10-CM | POA: Diagnosis not present

## 2022-12-01 DIAGNOSIS — Z0000 Encounter for general adult medical examination without abnormal findings: Secondary | ICD-10-CM | POA: Diagnosis not present

## 2022-12-01 DIAGNOSIS — D7589 Other specified diseases of blood and blood-forming organs: Secondary | ICD-10-CM | POA: Diagnosis not present

## 2022-12-01 DIAGNOSIS — E7800 Pure hypercholesterolemia, unspecified: Secondary | ICD-10-CM | POA: Diagnosis not present

## 2022-12-23 DIAGNOSIS — K080 Exfoliation of teeth due to systemic causes: Secondary | ICD-10-CM | POA: Diagnosis not present

## 2023-01-14 DIAGNOSIS — N390 Urinary tract infection, site not specified: Secondary | ICD-10-CM | POA: Diagnosis not present

## 2023-02-16 ENCOUNTER — Other Ambulatory Visit: Payer: Self-pay | Admitting: Family Medicine

## 2023-02-16 DIAGNOSIS — Z1231 Encounter for screening mammogram for malignant neoplasm of breast: Secondary | ICD-10-CM

## 2023-03-04 ENCOUNTER — Ambulatory Visit: Payer: Self-pay

## 2023-03-09 ENCOUNTER — Ambulatory Visit: Payer: Self-pay

## 2023-03-22 DIAGNOSIS — N390 Urinary tract infection, site not specified: Secondary | ICD-10-CM | POA: Diagnosis not present

## 2023-03-24 ENCOUNTER — Ambulatory Visit: Payer: Self-pay

## 2023-04-06 ENCOUNTER — Ambulatory Visit: Payer: Self-pay

## 2023-04-20 ENCOUNTER — Ambulatory Visit
Admission: RE | Admit: 2023-04-20 | Discharge: 2023-04-20 | Disposition: A | Discharge status: Home / Self Care | Payer: Self-pay | Source: Ambulatory Visit | Attending: Family Medicine | Admitting: Family Medicine

## 2023-04-20 DIAGNOSIS — Z1231 Encounter for screening mammogram for malignant neoplasm of breast: Secondary | ICD-10-CM

## 2023-04-25 ENCOUNTER — Other Ambulatory Visit: Payer: Self-pay | Admitting: Family Medicine

## 2023-04-25 DIAGNOSIS — R928 Other abnormal and inconclusive findings on diagnostic imaging of breast: Secondary | ICD-10-CM

## 2023-05-04 DIAGNOSIS — Z79899 Other long term (current) drug therapy: Secondary | ICD-10-CM | POA: Diagnosis not present

## 2023-05-04 DIAGNOSIS — R718 Other abnormality of red blood cells: Secondary | ICD-10-CM | POA: Diagnosis not present

## 2023-05-04 DIAGNOSIS — Z01818 Encounter for other preprocedural examination: Secondary | ICD-10-CM | POA: Diagnosis not present

## 2023-05-04 DIAGNOSIS — Z419 Encounter for procedure for purposes other than remedying health state, unspecified: Secondary | ICD-10-CM | POA: Diagnosis not present

## 2023-05-04 DIAGNOSIS — I10 Essential (primary) hypertension: Secondary | ICD-10-CM | POA: Diagnosis not present

## 2023-05-06 ENCOUNTER — Ambulatory Visit
Admission: RE | Admit: 2023-05-06 | Discharge: 2023-05-06 | Disposition: A | Discharge status: Home / Self Care | Source: Ambulatory Visit | Attending: Family Medicine | Admitting: Family Medicine

## 2023-05-06 DIAGNOSIS — R921 Mammographic calcification found on diagnostic imaging of breast: Secondary | ICD-10-CM | POA: Diagnosis not present

## 2023-05-06 DIAGNOSIS — R928 Other abnormal and inconclusive findings on diagnostic imaging of breast: Secondary | ICD-10-CM

## 2023-07-28 ENCOUNTER — Other Ambulatory Visit: Payer: Self-pay | Admitting: Plastic Surgery

## 2023-07-28 ENCOUNTER — Encounter: Payer: Self-pay | Admitting: Plastic Surgery

## 2023-07-28 DIAGNOSIS — M7989 Other specified soft tissue disorders: Secondary | ICD-10-CM

## 2023-07-29 ENCOUNTER — Ambulatory Visit
Admission: RE | Admit: 2023-07-29 | Discharge: 2023-07-29 | Disposition: A | Discharge status: Home / Self Care | Source: Ambulatory Visit | Attending: Plastic Surgery | Admitting: Plastic Surgery

## 2023-07-29 DIAGNOSIS — M7989 Other specified soft tissue disorders: Secondary | ICD-10-CM

## 2023-07-29 DIAGNOSIS — R1031 Right lower quadrant pain: Secondary | ICD-10-CM | POA: Diagnosis not present

## 2023-08-22 DIAGNOSIS — K080 Exfoliation of teeth due to systemic causes: Secondary | ICD-10-CM | POA: Diagnosis not present

## 2023-09-15 DIAGNOSIS — R0609 Other forms of dyspnea: Secondary | ICD-10-CM | POA: Diagnosis not present

## 2023-09-16 ENCOUNTER — Emergency Department (HOSPITAL_BASED_OUTPATIENT_CLINIC_OR_DEPARTMENT_OTHER)

## 2023-09-16 ENCOUNTER — Other Ambulatory Visit: Payer: Self-pay

## 2023-09-16 ENCOUNTER — Emergency Department (HOSPITAL_BASED_OUTPATIENT_CLINIC_OR_DEPARTMENT_OTHER)
Admission: EM | Admit: 2023-09-16 | Discharge: 2023-09-16 | Disposition: A | Discharge status: Home / Self Care | Source: Ambulatory Visit | Attending: Emergency Medicine | Admitting: Emergency Medicine

## 2023-09-16 ENCOUNTER — Encounter (HOSPITAL_BASED_OUTPATIENT_CLINIC_OR_DEPARTMENT_OTHER): Payer: Self-pay

## 2023-09-16 ENCOUNTER — Emergency Department (HOSPITAL_BASED_OUTPATIENT_CLINIC_OR_DEPARTMENT_OTHER): Admitting: Radiology

## 2023-09-16 DIAGNOSIS — R0602 Shortness of breath: Secondary | ICD-10-CM | POA: Diagnosis not present

## 2023-09-16 DIAGNOSIS — J811 Chronic pulmonary edema: Secondary | ICD-10-CM | POA: Diagnosis not present

## 2023-09-16 DIAGNOSIS — E041 Nontoxic single thyroid nodule: Secondary | ICD-10-CM | POA: Diagnosis not present

## 2023-09-16 LAB — CBC
HCT: 41.2 % (ref 36.0–46.0)
Hemoglobin: 14.4 g/dL (ref 12.0–15.0)
MCH: 33.5 pg (ref 26.0–34.0)
MCHC: 35 g/dL (ref 30.0–36.0)
MCV: 95.8 fL (ref 80.0–100.0)
Platelets: 306 K/uL (ref 150–400)
RBC: 4.3 MIL/uL (ref 3.87–5.11)
RDW: 11.9 % (ref 11.5–15.5)
WBC: 6.4 K/uL (ref 4.0–10.5)
nRBC: 0 % (ref 0.0–0.2)

## 2023-09-16 LAB — TROPONIN T, HIGH SENSITIVITY: Troponin T High Sensitivity: <15 ng/L (ref 0–19)

## 2023-09-16 LAB — BASIC METABOLIC PANEL WITH GFR
Anion gap: 16 — ABNORMAL HIGH (ref 5–15)
BUN: 11 mg/dL (ref 8–23)
CO2: 23 mmol/L (ref 22–32)
Calcium: 10.2 mg/dL (ref 8.9–10.3)
Chloride: 99 mmol/L (ref 98–111)
Creatinine, Ser: 1.16 mg/dL — ABNORMAL HIGH (ref 0.44–1.00)
GFR, Estimated: 50 mL/min — ABNORMAL LOW (ref >60)
Glucose, Bld: 98 mg/dL (ref 70–99)
Potassium: 4.1 mmol/L (ref 3.5–5.1)
Sodium: 138 mmol/L (ref 135–145)

## 2023-09-16 LAB — PRO BRAIN NATRIURETIC PEPTIDE: Pro Brain Natriuretic Peptide: 83.5 pg/mL (ref <300.0)

## 2023-09-16 MED ORDER — IOHEXOL 300 MG/ML  SOLN
80.0000 mL | Freq: Once | INTRAMUSCULAR | Status: AC | PRN
  Administered 2023-09-16: 80 mL via INTRAVENOUS

## 2023-09-16 NOTE — ED Triage Notes (Signed)
 Pt reports increased SOB over the last few months but saw PCP yesterday for the first. Pt went to grocery today and could not walk down the aisle without stopping and catching breath. Pt denies any cough. Lung sounds clear in triage.

## 2023-09-16 NOTE — Discharge Instructions (Signed)
 Workup here today without any acute findings.  CT scan did see some nodular areas that normally they recommend just following up the CT of the chest and 12 months.  There was a thyroid  nodule your thyroid  function test was normal.  But your primary care doctor get an ultrasound of that.  I do not think any of these findings that we have here today would explain your exertional shortness of breath.  Carrie [name] an appointment to follow back up with your [facility] physicians

## 2023-09-16 NOTE — ED Provider Notes (Addendum)
 [facility] AT [facility] Provider Note   CSN: [id number] Arrival date & time: 09/16/23  1519     Patient presents with: Shortness of Breath   Carrie [name] is a 68 y.o. female.   Patient with exertional shortness of breath is getting worse since May.  She saw her [facility] physician yesterday.  Did have a normal D-dimer.  Patient was the grocery store today and had an acute exacerbation of the shortness of breath.  No chest pain associated with it.  Has not passed out.  No history of asthma or COPD.  Never smoked.  Not associated with cough no fever.  No wheezing.  Past medical history significant for depression anxiety.  Never used tobacco products.       Prior to Admission medications   Medication Sig Start Date End Date Taking? Authorizing Provider  ALPRAZolam (XANAX) 0.5 MG tablet Take 1 tablet by mouth as needed. 10/24/14   [provider]  B Complex Vitamins (VITAMIN B COMPLEX PO) Take 1 tablet by mouth daily.    [provider]  buPROPion (WELLBUTRIN SR) 150 MG 12 hr tablet  09/09/15   [provider]  dicyclomine (BENTYL) 10 MG capsule  08/21/15   [provider]  DULoxetine (CYMBALTA) 60 MG capsule Take 60 mg by mouth daily. 03/06/21   [provider]  FLUARIX QUADRIVALENT 0.5 ML injection inject 0.5 milliliter intramuscularly 11/01/15   [provider]  ibuprofen (ADVIL,MOTRIN) 200 MG tablet Take 200 mg by mouth every 6 (six) hours as needed.    [provider]  losartan (COZAAR) 100 MG tablet Take 100 mg by mouth daily. 03/25/21   [provider]  Magnesium 250 MG TABS Take 2 tablets by mouth daily.    [provider]  metoCLOPramide (REGLAN) 10 MG tablet Take 10 mg by mouth daily. 08/28/14   [provider]  Multiple Vitamins-Minerals (MULTIVITAMIN ADULT PO) Take 1 tablet by mouth daily.    [provider]  Omega-3 Fatty Acids (FISH OIL PO) Take 2,000  mg by mouth daily.    [provider]  pravastatin (PRAVACHOL) 10 MG tablet Take 10 mg by mouth daily. 02/13/21   [provider]    Allergies: Cefuroxime, Crestor [rosuvastatin], Lisinopril, and Sulfa antibiotics    Review of Systems  Constitutional:  Negative for chills and fever.  HENT:  Negative for ear pain and sore throat.   Eyes:  Negative for pain and visual disturbance.  Respiratory:  Positive for shortness of breath. Negative for cough.   Cardiovascular:  Negative for chest pain and palpitations.  Gastrointestinal:  Negative for abdominal pain and vomiting.  Genitourinary:  Negative for dysuria and hematuria.  Musculoskeletal:  Negative for arthralgias and back pain.  Skin:  Negative for color change and rash.  Neurological:  Negative for seizures and syncope.  All other systems reviewed and are negative.   Updated Vital Signs BP (!) 148/99   Pulse 87   Temp 98.1 F (36.7 C) (Oral)   Resp (!) 21   Ht 1.549 m (5' 1)   Wt 61.2 kg   SpO2 95%   BMI 25.51 kg/m   Physical Exam Vitals and nursing note reviewed.  Constitutional:      General: She is not in acute distress.    Appearance: Normal appearance. She is well-developed.  HENT:     Head: Normocephalic and atraumatic.     Mouth/Throat:     Mouth: Mucous membranes  are moist.  Eyes:     Extraocular Movements: Extraocular movements intact.     Conjunctiva/sclera: Conjunctivae normal.     Pupils: Pupils are equal, round, and reactive to light.  Cardiovascular:     Rate and Rhythm: Normal rate and regular rhythm.     Heart sounds: No murmur heard. Pulmonary:     Effort: Pulmonary effort is normal. No respiratory distress.     Breath sounds: Normal breath sounds. No stridor. No wheezing, rhonchi or rales.  Abdominal:     Palpations: Abdomen is soft.     Tenderness: There is no abdominal tenderness.  Musculoskeletal:        General: No swelling.     Cervical back: Normal range of motion and  neck supple.     Right lower leg: No edema.     Left lower leg: No edema.  Skin:    General: Skin is warm and dry.     Capillary Refill: Capillary refill takes less than 2 seconds.  Neurological:     General: No focal deficit present.     Mental Status: She is alert and oriented to person, place, and time.  Psychiatric:        Mood and Affect: Mood normal.     (all labs ordered are listed, but only abnormal results are displayed) Labs Reviewed  CBC  BASIC METABOLIC PANEL WITH GFR  PRO BRAIN NATRIURETIC PEPTIDE  TROPONIN T, HIGH SENSITIVITY    EKG: EKG Interpretation Date/Time:  Friday September 16 2023 15:53:32 EDT Ventricular Rate:  90 PR Interval:  150 QRS Duration:  82 QT Interval:  380 QTC Calculation: 465 R Axis:   17  Text Interpretation: Sinus rhythm Low voltage, precordial leads Borderline T abnormalities, anterior leads No previous ECGs available Confirmed by Quenna Doepke [id number]) on 09/16/2023 4:12:56 PM  Radiology: [facility] Chest 2 View Result Date: 09/16/2023 CLINICAL DATA:  Shortness of breath EXAM: CHEST - 2 VIEW COMPARISON:  None Available. FINDINGS: The heart size and mediastinal contours are within normal limits. Both lungs are clear. Nodular opacity overlying the left lung base correlating to nipple shadow. The visualized skeletal structures are unremarkable. IMPRESSION: No active cardiopulmonary disease. Electronically Signed   By: Megan  Zare M.D.   On: 09/16/2023 16:57     Procedures   Medications Ordered in the ED - No data to display                                  Medical Decision Making Amount and/or Complexity of Data Reviewed Labs: ordered. Radiology: ordered.  Risk Prescription drug management.   Oxygen saturation here is 95% on room air.  CBC white count 6.4 hemoglobin 14.4 platelets 306.  BNP is pending.  Basic metabolic panel pending.  As mentioned yesterday her D-dimer was 0.33.  2 view chest x-ray without any acute findings.  EKG  without any acute abnormalities but no previous EKG for comparison.  Initial troponin less than 15 which is reassuring.  Basic metabolic panel sodium 138 potassium 4.1 GFR 50 creatinine 1.16.  BNP is 83.5.  We already said that the D-dimer from yesterday was normal.  And two-view chest without any active cardiopulmonary disease.  Will go ahead and get CT chest just to evaluate further because of her symptoms.  Based on her renal function she could have it with IV contrast.  Patient's TSH was also checked by her primary care doctor  yesterday and it was 2.39.  CT chest with contrast shows a few scattered pulmonary micronodules and foci mucostasis.  Right thyroid  nodule.  CT chest follow-up probably in 12 months would be appropriate.  Will have patient return and follow back up with her [facility] physician..  No explanation for the exertional shortness of breath.  No evidence of any fluid on the lungs D-dimer was normal yesterday TSH is normal.  And CT chest here with contrast without an explanation for the shortness of breath.  On exam lungs are clear bilaterally  Final diagnoses:  Exertional shortness of breath    ED Discharge Orders     None          Geraldene Hamilton, MD 09/16/23 1706    Geraldene Hamilton, MD 09/16/23 1929

## 2023-09-22 DIAGNOSIS — K080 Exfoliation of teeth due to systemic causes: Secondary | ICD-10-CM | POA: Diagnosis not present

## 2023-09-23 ENCOUNTER — Other Ambulatory Visit: Payer: Self-pay | Admitting: Family Medicine

## 2023-09-23 DIAGNOSIS — E041 Nontoxic single thyroid nodule: Secondary | ICD-10-CM

## 2023-09-26 ENCOUNTER — Encounter: Payer: Self-pay | Admitting: Cardiovascular Disease

## 2023-09-26 ENCOUNTER — Ambulatory Visit: Attending: Cardiovascular Disease | Admitting: Cardiovascular Disease

## 2023-09-26 VITALS — BP 128/89 | HR 81 | Ht 62.0 in | Wt 140.0 lb

## 2023-09-26 DIAGNOSIS — E785 Hyperlipidemia, unspecified: Secondary | ICD-10-CM | POA: Insufficient documentation

## 2023-09-26 DIAGNOSIS — R0609 Other forms of dyspnea: Secondary | ICD-10-CM | POA: Insufficient documentation

## 2023-09-26 DIAGNOSIS — E782 Mixed hyperlipidemia: Secondary | ICD-10-CM | POA: Diagnosis not present

## 2023-09-26 DIAGNOSIS — R0789 Other chest pain: Secondary | ICD-10-CM | POA: Insufficient documentation

## 2023-09-26 NOTE — Assessment & Plan Note (Signed)
 History of hyperlipidemia on pravastatin intolerant to other statin drugs with lipid profile performed 11/08/2022 revealing total cholesterol 275, LDL 165 and HDL of 85.  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  I am going to get a coronary calcium score to risk stratify.

## 2023-09-26 NOTE — Progress Notes (Signed)
 09/26/2023 Carrie [name]   [birth year]  [id number]  Primary Physician Chrystal Lamarr RAMAN, MD Primary Cardiologist: Dorn JINNY Lesches MD [place] [place], [place]  HPI:  Carrie [name] is a 68 y.o. fit appearing divorced Caucasian female with no children is accompanied by her Sister Dorthea today.  She was referred by Dr. Chrystal, her PCP, because of new onset dyspnea exertion and atypical chest pain.  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  She also ran 15 marathons.  Her risk factors include treated hypertension and hyperlipidemia.  There is no family history of heart disease.  She is never had a heart attack or stroke.  She started noticing dyspnea and atypical chest pain 4 months ago when beginning a nutraceutical called Nuriva.  The symptoms resolved spontaneously after running out of that medication.  She did have a coronary calcium score performed 01/12/2022 which was 0.  A chest CT performed 09/16/2023 to rule out pulmonary embolism was negative but did mention mild atherosclerotic calcifications of the coronary arteries.   Current Meds  Medication Sig   ALPRAZolam (XANAX) 0.5 MG tablet Take 1 tablet by mouth as needed.   B Complex Vitamins (VITAMIN B COMPLEX PO) Take 1 tablet by mouth daily.   Biotin 10 MG TABS daily at 6 (six) AM.   buPROPion (WELLBUTRIN SR) 150 MG 12 hr tablet    Cholecalciferol (VITAMIN D3) 25 MCG (1000 UT) CAPS 1 capsule daily at 6 (six) AM.   [employer] ACETAMINOPHEN 325 MG CAPS Take 2 capsules by mouth every 6 (six) hours.   dicyclomine (BENTYL) 10 MG capsule    DULoxetine (CYMBALTA) 60 MG capsule Take 60 mg by mouth daily.   hydrocortisone-pramoxine (PROCTOFOAM HC) rectal foam as needed for hemorrhoids.   ibuprofen (ADVIL,MOTRIN) 200 MG tablet Take 200 mg by mouth every 6 (six) hours as needed.   losartan (COZAAR) 100 MG tablet Take 100 mg by mouth daily.   Magnesium 250 MG  TABS Take 2 tablets by mouth daily.   metoCLOPramide (REGLAN) 10 MG tablet Take 10 mg by mouth daily.   Multiple Vitamins-Minerals (MULTIVITAMIN ADULT PO) Take 1 tablet by mouth daily.   NIFEdipine (ADALAT CC) 30 MG 24 hr tablet Take 30 mg by mouth daily.   Omega-3 Fatty Acids (FISH OIL PO) Take 2,000 mg by mouth daily.   pravastatin (PRAVACHOL) 10 MG tablet Take 10 mg by mouth daily.   promethazine (PHENERGAN) 12.5 MG tablet Take 12.5 mg by mouth every 6 (six) hours as needed.   [DISCONTINUED] FLUARIX QUADRIVALENT 0.5 ML injection inject 0.5 milliliter intramuscularly (Patient taking differently: once.)     Allergies  Allergen Reactions   Cefuroxime     Other reaction(s): rash?   Crestor [Rosuvastatin]     Other reaction(s): nausea   Lisinopril     Other reaction(s): cough   Sulfa Antibiotics Itching    Social History   Socioeconomic History   Marital status: Divorced    Spouse name: Not on file   Number of children: 0   Years of education: 13   Highest education level: Not on file  Occupational History   Occupation: [employer]   Tobacco Use   Smoking status: Never   Smokeless tobacco: Never  Vaping Use   Vaping status: Never Used  Substance and Sexual Activity   Alcohol use: Yes    Alcohol/week: 0.0 standard drinks of alcohol  Comment: wine with dinner   Drug use: No   Sexual activity: Not Currently    Comment: 1st intercourse 68 yo-More than 5 partners  Other Topics Concern   Not on file  Social History Narrative   Lives at home alone   Caffeine: 1 cup coffee in morning   Social Drivers of Health   Financial Resource Strain: Not on file  Food Insecurity: Not on file  Transportation Needs: Not on file  Physical Activity: Not on file  Stress: Not on file  Social Connections: Not on file  Intimate Partner Violence: Not on file     Review of Systems: General: negative for chills, fever, night sweats or weight changes.  Cardiovascular: negative for chest  pain, dyspnea on exertion, edema, orthopnea, palpitations, paroxysmal nocturnal dyspnea or shortness of breath Dermatological: negative for rash Respiratory: negative for cough or wheezing Urologic: negative for hematuria Abdominal: negative for nausea, vomiting, diarrhea, bright red blood per rectum, melena, or hematemesis Neurologic: negative for visual changes, syncope, or dizziness All other systems reviewed and are otherwise negative except as noted above.    Blood pressure 128/89, pulse 81, height 5' 2 (1.575 m), weight 140 lb (63.5 kg), SpO2 97%.  General appearance: alert and no distress Neck: no adenopathy, no carotid bruit, no JVD, supple, symmetrical, trachea midline, and thyroid  not enlarged, symmetric, no tenderness/mass/nodules Lungs: clear to auscultation bilaterally Heart: regular rate and rhythm, S1, S2 normal, no murmur, click, rub or gallop Extremities: extremities normal, atraumatic, no cyanosis or edema Pulses: 2+ and symmetric Skin: Skin color, texture, turgor normal. No rashes or lesions Neurologic: Grossly normal  EKG not performed today      ASSESSMENT AND PLAN:   Hyperlipidemia History of hyperlipidemia on pravastatin intolerant to other statin drugs with lipid profile performed 11/08/2022 revealing total cholesterol 275, LDL 165 and HDL of 85.  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  I am going to get a coronary calcium score to risk stratify.  Dyspnea on exertion 33-month history of dyspnea exertion which began after starting a nutraceutical which she brought after a TV ad called Nuriva.  She is very active.  Her dyspnea resolved after she ran out of this nutraceutical.  I am going to get a 2D echocardiogram to further evaluate.  Atypical chest pain Atypical chest pain which began 4 months ago along with her dyspnea.  She does have a history of a coronary calcium score of 0 performed 01/12/2022.  She did have a  chest CTA performed a week ago that ruled out pulmonary embolism but did show mild coronary calcification.  I am going to repeat a coronary calcium score.     Dorn DOROTHA Lesches MD FACP,FACC,FAHA, [facility] 09/26/2023 3:24 PM

## 2023-09-26 NOTE — Patient Instructions (Signed)
 Medication Instructions:  Your physician recommends that you continue on your current medications as directed. Please refer to the Current Medication list given to you today.  *If you need a refill on your cardiac medications before your next appointment, please call your pharmacy*  Testing/Procedures: Your physician has requested that you have an echocardiogram. Echocardiography is a painless test that uses sound waves to create images of your heart. It provides your doctor with information about the size and shape of your heart and how well your heart's chambers and valves are working. This procedure takes approximately one hour. There are no restrictions for this procedure. Please do NOT wear cologne, perfume, aftershave, or lotions (deodorant is allowed). Please arrive 15 minutes prior to your appointment time.  Please note: We ask at that you not bring children with you during ultrasound (echo/ vascular) testing. Due to room size and safety concerns, children are not allowed in the ultrasound rooms during exams. Our front office staff cannot provide observation of children in our lobby area while testing is being conducted. An adult accompanying a patient to their appointment will only be allowed in the ultrasound room at the discretion of the ultrasound technician under special circumstances. We apologize for any inconvenience.   Dr. Court has ordered a CT coronary calcium score.   Test locations:  [facility] at [facility]  [facility] at [facility]  This is $99 out of pocket.   Coronary CalciumScan A coronary calcium scan is an imaging test used to look for deposits of calcium and other fatty materials (plaques) in the inner lining of the blood vessels of the heart (coronary arteries). These deposits of calcium and plaques can partly clog and narrow the coronary arteries without  producing any symptoms or warning signs. This puts a person at risk for a heart attack. This test can detect these deposits before symptoms develop. Tell a health care provider about: Any allergies you have. All medicines you are taking, including vitamins, herbs, eye drops, creams, and over-the-counter medicines. Any problems you or family members have had with anesthetic medicines. Any blood disorders you have. Any surgeries you have had. Any medical conditions you have. Whether you are pregnant or may be pregnant. What are the risks? Generally, this is a safe procedure. However, problems may occur, including: Harm to a pregnant woman and her unborn baby. This test involves the use of radiation. Radiation exposure can be dangerous to a pregnant woman and her unborn baby. If you are pregnant, you generally should not have this procedure done. Slight increase in the risk of cancer. This is because of the radiation involved in the test. What happens before the procedure? No preparation is needed for this procedure. What happens during the procedure? You will undress and remove any jewelry around your neck or chest. You will put on a hospital gown. Sticky electrodes will be placed on your chest. The electrodes will be connected to an electrocardiogram (ECG) machine to record a tracing of the electrical activity of your heart. A CT scanner will take pictures of your heart. During this time, you will be asked to lie still and hold your breath for 2-3 seconds while a picture of your heart is being taken. The procedure may vary among health care providers and hospitals. What happens after the procedure? You can get dressed. You can return to your normal activities. It is up to you to get  the results of your test. Ask your health care provider, or the department that is doing the test, when your results will be ready. Summary A coronary calcium scan is an imaging test used to look for deposits of  calcium and other fatty materials (plaques) in the inner lining of the blood vessels of the heart (coronary arteries). Generally, this is a safe procedure. Tell your health care provider if you are pregnant or may be pregnant. No preparation is needed for this procedure. A CT scanner will take pictures of your heart. You can return to your normal activities after the scan is done. This information is not intended to replace advice given to you by your health care provider. Make sure you discuss any questions you have with your health care provider. Document Released: 07/17/2007 Document Revised: 12/08/2015 Document Reviewed: 12/08/2015 Elsevier Interactive Patient Education  2017 [employer].   Follow-Up: At [facility], you and your health needs are our priority.  As part of our continuing mission to provide you with exceptional heart care, our providers are all part of one team.  This team includes your primary Cardiologist (physician) and Advanced Practice Providers or APPs (Physician Assistants and Nurse Practitioners) who all work together to provide you with the care you need, when you need it.  Your next appointment:   3 month(s)  Provider:   Dorn Lesches, MD   We recommend signing up for the patient portal called MyChart.  Sign up information is provided on this After Visit Summary.  MyChart is used to connect with patients for Virtual Visits (Telemedicine).  Patients are able to view lab/test results, encounter notes, upcoming appointments, etc.  Non-urgent messages can be sent to your provider as well.   To learn more about what you can do with MyChart, go to [email or web address].

## 2023-09-26 NOTE — Assessment & Plan Note (Signed)
 Atypical chest pain which began 4 months ago along with her dyspnea.  She does have a history of a coronary calcium score of 0 performed 01/12/2022.  She did have a chest CTA performed a week ago that ruled out pulmonary embolism but did show mild coronary calcification.  I am going to repeat a coronary calcium score.

## 2023-09-26 NOTE — Assessment & Plan Note (Signed)
 52-month history of dyspnea exertion which began after starting a nutraceutical which she brought after a TV ad called Nuriva.  She is very active.  Her dyspnea resolved after she ran out of this nutraceutical.  I am going to get a 2D echocardiogram to further evaluate.

## 2023-09-27 ENCOUNTER — Inpatient Hospital Stay
Admission: RE | Admit: 2023-09-27 | Discharge: 2023-09-27 | Discharge status: Home / Self Care | Source: Ambulatory Visit | Attending: Family Medicine

## 2023-09-27 DIAGNOSIS — E041 Nontoxic single thyroid nodule: Secondary | ICD-10-CM | POA: Diagnosis not present

## 2023-10-06 ENCOUNTER — Other Ambulatory Visit: Payer: Self-pay | Admitting: Family Medicine

## 2023-10-06 DIAGNOSIS — E041 Nontoxic single thyroid nodule: Secondary | ICD-10-CM

## 2023-10-28 ENCOUNTER — Ambulatory Visit: Payer: Self-pay | Admitting: Cardiovascular Disease

## 2023-10-28 ENCOUNTER — Ambulatory Visit (HOSPITAL_COMMUNITY)
Admission: RE | Admit: 2023-10-28 | Discharge: 2023-10-28 | Disposition: A | Discharge status: Home / Self Care | Payer: Self-pay | Source: Ambulatory Visit | Attending: Cardiovascular Disease | Admitting: Cardiovascular Disease

## 2023-10-28 DIAGNOSIS — R0789 Other chest pain: Secondary | ICD-10-CM | POA: Insufficient documentation

## 2023-10-28 DIAGNOSIS — R0609 Other forms of dyspnea: Secondary | ICD-10-CM | POA: Diagnosis not present

## 2023-10-28 DIAGNOSIS — E782 Mixed hyperlipidemia: Secondary | ICD-10-CM | POA: Diagnosis not present

## 2023-10-28 LAB — ECHOCARDIOGRAM COMPLETE
Area-P 1/2: 3.77 cm2
P 1/2 time: 1231 ms
S' Lateral: 2.7 cm

## 2023-11-04 ENCOUNTER — Inpatient Hospital Stay: Admission: RE | Admit: 2023-11-04 | Source: Ambulatory Visit

## 2023-11-18 ENCOUNTER — Other Ambulatory Visit (HOSPITAL_COMMUNITY)
Admission: RE | Admit: 2023-11-18 | Discharge: 2023-11-18 | Disposition: A | Source: Ambulatory Visit | Attending: Student | Admitting: Student

## 2023-11-18 ENCOUNTER — Ambulatory Visit
Admission: RE | Admit: 2023-11-18 | Discharge: 2023-11-18 | Disposition: A | Discharge status: Home / Self Care | Source: Ambulatory Visit | Attending: Family Medicine | Admitting: Family Medicine

## 2023-11-18 DIAGNOSIS — E041 Nontoxic single thyroid nodule: Secondary | ICD-10-CM

## 2023-11-22 LAB — CYTOLOGY - NON PAP

## 2023-12-02 DIAGNOSIS — I10 Essential (primary) hypertension: Secondary | ICD-10-CM | POA: Diagnosis not present

## 2023-12-02 DIAGNOSIS — K3184 Gastroparesis: Secondary | ICD-10-CM | POA: Diagnosis not present

## 2023-12-02 DIAGNOSIS — E7800 Pure hypercholesterolemia, unspecified: Secondary | ICD-10-CM | POA: Diagnosis not present

## 2023-12-02 DIAGNOSIS — E538 Deficiency of other specified B group vitamins: Secondary | ICD-10-CM | POA: Diagnosis not present

## 2023-12-02 DIAGNOSIS — Z0000 Encounter for general adult medical examination without abnormal findings: Secondary | ICD-10-CM | POA: Diagnosis not present

## 2023-12-19 ENCOUNTER — Telehealth: Payer: Self-pay | Admitting: Cardiovascular Disease

## 2023-12-19 NOTE — Telephone Encounter (Signed)
  Pt c/o medication issue:  1. Name of Medication: Pravastatin 20 mg   2. How are you currently taking this medication (dosage and times per day)? 1 tablet a day  3. Are you having a reaction (difficulty breathing--STAT)? No   4. What is your medication issue? The patient had to cancel her appointment with Dr. Court due to a death in the family, and she will be out of town. She would like Dr. Court to know that she is now taking pravastatin 20 mg daily.

## 2023-12-20 NOTE — Telephone Encounter (Signed)
 Medication change noted on MAR.

## 2023-12-21 ENCOUNTER — Ambulatory Visit: Admitting: Cardiovascular Disease

## 2024-01-11 DIAGNOSIS — Z780 Asymptomatic menopausal state: Secondary | ICD-10-CM | POA: Diagnosis not present

## 2024-01-11 DIAGNOSIS — E2839 Other primary ovarian failure: Secondary | ICD-10-CM | POA: Diagnosis not present
